# Patient Record
Sex: Female | Born: 1965 | Race: Black or African American | Hispanic: No | Marital: Married | Smoking: Former smoker
Health system: Southern US, Community
[De-identification: ages and names within clinical notes are randomized; demographics above are authoritative.]

## PROBLEM LIST (undated history)

## (undated) DIAGNOSIS — E785 Hyperlipidemia, unspecified: Secondary | ICD-10-CM

## (undated) DIAGNOSIS — K219 Gastro-esophageal reflux disease without esophagitis: Secondary | ICD-10-CM

## (undated) DIAGNOSIS — J45909 Unspecified asthma, uncomplicated: Secondary | ICD-10-CM

## (undated) DIAGNOSIS — I10 Essential (primary) hypertension: Secondary | ICD-10-CM

## (undated) DIAGNOSIS — J189 Pneumonia, unspecified organism: Secondary | ICD-10-CM

## (undated) DIAGNOSIS — K429 Umbilical hernia without obstruction or gangrene: Secondary | ICD-10-CM

## (undated) DIAGNOSIS — D649 Anemia, unspecified: Secondary | ICD-10-CM

## (undated) HISTORY — PX: DILATION AND CURETTAGE OF UTERUS: SHX78

## (undated) HISTORY — DX: Umbilical hernia without obstruction or gangrene: K42.9

## (undated) HISTORY — DX: Hyperlipidemia, unspecified: E78.5

---

## 2014-02-20 DIAGNOSIS — N870 Mild cervical dysplasia: Secondary | ICD-10-CM | POA: Insufficient documentation

## 2016-08-06 DIAGNOSIS — M25551 Pain in right hip: Secondary | ICD-10-CM | POA: Insufficient documentation

## 2016-08-06 HISTORY — DX: Pain in right hip: M25.551

## 2017-02-09 DIAGNOSIS — D259 Leiomyoma of uterus, unspecified: Secondary | ICD-10-CM | POA: Insufficient documentation

## 2017-05-24 DIAGNOSIS — H04203 Unspecified epiphora, bilateral lacrimal glands: Secondary | ICD-10-CM | POA: Insufficient documentation

## 2017-05-24 DIAGNOSIS — H2513 Age-related nuclear cataract, bilateral: Secondary | ICD-10-CM | POA: Insufficient documentation

## 2019-12-28 ENCOUNTER — Encounter (HOSPITAL_COMMUNITY): Payer: Self-pay | Admitting: Emergency Medicine

## 2019-12-28 ENCOUNTER — Emergency Department (HOSPITAL_COMMUNITY): Payer: Medicaid Other

## 2019-12-28 ENCOUNTER — Emergency Department (HOSPITAL_COMMUNITY)
Admission: EM | Admit: 2019-12-28 | Discharge: 2019-12-28 | Disposition: A | Discharge status: Home / Self Care | Payer: Medicaid Other | Attending: Emergency Medicine | Admitting: Emergency Medicine

## 2019-12-28 ENCOUNTER — Other Ambulatory Visit: Payer: Self-pay

## 2019-12-28 DIAGNOSIS — J45909 Unspecified asthma, uncomplicated: Secondary | ICD-10-CM | POA: Diagnosis not present

## 2019-12-28 DIAGNOSIS — I10 Essential (primary) hypertension: Secondary | ICD-10-CM | POA: Diagnosis not present

## 2019-12-28 DIAGNOSIS — R6883 Chills (without fever): Secondary | ICD-10-CM | POA: Insufficient documentation

## 2019-12-28 DIAGNOSIS — Z20822 Contact with and (suspected) exposure to covid-19: Secondary | ICD-10-CM | POA: Insufficient documentation

## 2019-12-28 DIAGNOSIS — R059 Cough, unspecified: Secondary | ICD-10-CM | POA: Insufficient documentation

## 2019-12-28 DIAGNOSIS — J40 Bronchitis, not specified as acute or chronic: Secondary | ICD-10-CM

## 2019-12-28 DIAGNOSIS — R300 Dysuria: Secondary | ICD-10-CM | POA: Insufficient documentation

## 2019-12-28 HISTORY — DX: Essential (primary) hypertension: I10

## 2019-12-28 HISTORY — DX: Unspecified asthma, uncomplicated: J45.909

## 2019-12-28 LAB — RESPIRATORY PANEL BY RT PCR (FLU A&B, COVID)
Influenza A by PCR: NEGATIVE
Influenza B by PCR: NEGATIVE
SARS Coronavirus 2 by RT PCR: NEGATIVE

## 2019-12-28 LAB — URINALYSIS, ROUTINE W REFLEX MICROSCOPIC
Bilirubin Urine: NEGATIVE
Glucose, UA: NEGATIVE mg/dL
Hgb urine dipstick: NEGATIVE
Ketones, ur: NEGATIVE mg/dL
Leukocytes,Ua: NEGATIVE
Nitrite: NEGATIVE
Protein, ur: NEGATIVE mg/dL
Specific Gravity, Urine: 1.011 (ref 1.005–1.030)
pH: 7 (ref 5.0–8.0)

## 2019-12-28 LAB — CBC
HCT: 41.2 % (ref 36.0–46.0)
Hemoglobin: 12.9 g/dL (ref 12.0–15.0)
MCH: 26.8 pg (ref 26.0–34.0)
MCHC: 31.3 g/dL (ref 30.0–36.0)
MCV: 85.5 fL (ref 80.0–100.0)
Platelets: 202 10*3/uL (ref 150–400)
RBC: 4.82 MIL/uL (ref 3.87–5.11)
RDW: 15 % (ref 11.5–15.5)
WBC: 6.9 10*3/uL (ref 4.0–10.5)
nRBC: 0 % (ref 0.0–0.2)

## 2019-12-28 LAB — BASIC METABOLIC PANEL
Anion gap: 8 (ref 5–15)
BUN: 5 mg/dL — ABNORMAL LOW (ref 6–20)
CO2: 29 mmol/L (ref 22–32)
Calcium: 9.5 mg/dL (ref 8.9–10.3)
Chloride: 103 mmol/L (ref 98–111)
Creatinine, Ser: 0.61 mg/dL (ref 0.44–1.00)
GFR, Estimated: >60 mL/min (ref >60)
Glucose, Bld: 93 mg/dL (ref 70–99)
Potassium: 3.3 mmol/L — ABNORMAL LOW (ref 3.5–5.1)
Sodium: 140 mmol/L (ref 135–145)

## 2019-12-28 MED ORDER — ALBUTEROL SULFATE HFA 108 (90 BASE) MCG/ACT IN AERS
2.0000 | INHALATION_SPRAY | Freq: Once | RESPIRATORY_TRACT | Status: AC
  Administered 2019-12-28: 2 via RESPIRATORY_TRACT
  Filled 2019-12-28: qty 6.7

## 2019-12-28 MED ORDER — PREDNISONE 20 MG PO TABS
60.0000 mg | ORAL_TABLET | Freq: Once | ORAL | Status: AC
  Administered 2019-12-28: 60 mg via ORAL
  Filled 2019-12-28: qty 3

## 2019-12-28 MED ORDER — PREDNISONE 20 MG PO TABS: ORAL_TABLET | ORAL | 0 refills | Status: DC

## 2019-12-28 NOTE — Discharge Instructions (Addendum)
You might have bronchitis.  Take prednisone as prescribed.  Use albuterol every 4 hours as needed for cough or wheezing  See your doctor for follow-up.  You may have interstitial cystitis so consider following up with urology  Return to ER if you have worse trouble breathing, trouble urinating, vomiting, abdominal pain.

## 2019-12-28 NOTE — ED Triage Notes (Signed)
Pt arrives to ED with complaints of x2 weeks of dysuria. Denies hematuria and fevers. Ha shad chills. Also states cough x1 month.

## 2019-12-28 NOTE — ED Provider Notes (Signed)
[facility] EMERGENCY DEPARTMENT Provider Note   CSN: [id number] Arrival date & time: 12/28/19  1140     History Chief Complaint  Patient presents with  . Dysuria    Yvonne Turner is a 54 y.o. female history of asthma, hypertension here presenting with dysuria and cough.  Patient has been having dysuria for the last month or so.  She states that she has pain when she urinates.  Denies any blood in her urine.  Patient has went to urgent care twice.  She initially went the end of October and given a course of steroids that improved her symptoms.  She felt worse again and back to urgent care and was thought to have seasonal allergies so is on Zyrtec.  Patient states that she has persistent cough and just subjective chills.  She is fully vaccinated against COVID.  No obvious Covid exposures.   The history is provided by the patient.       Past Medical History:  Diagnosis Date  . Asthma   . Hypertension     There are no problems to display for this patient.   History reviewed. No pertinent surgical history.   OB History   No obstetric history on file.     History reviewed. No pertinent family history.  Social History   Tobacco Use  . Smoking status: Not on file  Substance Use Topics  . Alcohol use: Not on file  . Drug use: Not on file    Home Medications Prior to Admission medications   Not on File    Allergies    Patient has no known allergies.  Review of Systems   Review of Systems  Constitutional: Positive for chills.  Respiratory: Positive for cough.   Genitourinary: Positive for dysuria.  All other systems reviewed and are negative.   Physical Exam Updated Vital Signs BP (!) 147/86 (BP Location: Right Arm)   Pulse 88   Temp 98.1 F (36.7 C) (Oral)   Resp (!) 26   Ht 5\' 11"  (1.803 m)   Wt (!) 154.2 kg   SpO2 100%   BMI 47.42 kg/m   Physical Exam Vitals and nursing note reviewed.  Constitutional:      Appearance: Normal  appearance.  HENT:     Head: Normocephalic.     Nose: Nose normal.     Mouth/Throat:     Mouth: Mucous membranes are moist.  Eyes:     Extraocular Movements: Extraocular movements intact.     Pupils: Pupils are equal, round, and reactive to light.  Cardiovascular:     Rate and Rhythm: Normal rate and regular rhythm.     Pulses: Normal pulses.     Heart sounds: Normal heart sounds.  Pulmonary:     Comments: Diminished throughout, no wheezing or crackles  Abdominal:     General: Abdomen is flat.     Palpations: Abdomen is soft.  Musculoskeletal:        General: Normal range of motion.     Cervical back: Normal range of motion and neck supple.  Skin:    General: Skin is warm.     Capillary Refill: Capillary refill takes less than 2 seconds.  Neurological:     General: No focal deficit present.     Mental Status: She is alert and oriented to person, place, and time.  Psychiatric:        Mood and Affect: Mood normal.        Behavior: Behavior  normal.     ED Results / Procedures / Treatments   Labs (all labs ordered are listed, but only abnormal results are displayed) Labs Reviewed  BASIC METABOLIC PANEL - Abnormal; Notable for the following components:      Result Value   Potassium 3.3 (*)    BUN 5 (*)    All other components within normal limits  RESPIRATORY PANEL BY RT PCR (FLU A&B, COVID)  URINALYSIS, ROUTINE W REFLEX MICROSCOPIC  CBC    EKG None  Radiology No results found.  Procedures Procedures (including critical care time)  Medications Ordered in ED Medications  predniSONE (DELTASONE) tablet 60 mg (60 mg Oral Given 12/28/19 1513)  albuterol (VENTOLIN HFA) 108 (90 Base) MCG/ACT inhaler 2 puff (2 puffs Inhalation Given 12/28/19 1514)    ED Course  I have reviewed the triage vital signs and the nursing notes.  Pertinent labs & imaging results that were available during my care of the patient were reviewed by me and considered in my medical decision  making (see chart for details).    MDM Rules/Calculators/A&P                         Yvonne Turner is a 54 y.o. female here presenting with cough and chills and dysuria.  Symptoms are going on for about a month or so.  Likely viral infection versus bronchitis versus UTI.  Plan to get CBC and BMP and chest x-ray.   4:28 PM Labs and UA and chest x-ray unremarkable.  Covid is sent.  I am not clear why she has some dysuria.  Patient has no vaginal discharge or signs of vaginitis.  I wonder if it some local irritation or interstitial cystitis.  Can also has bronchitis as well.  Plan to discharge home with prednisone and albuterol.  Will refer to urology for evaluation of interstitial cystitis.   Final Clinical Impression(s) / ED Diagnoses Final diagnoses:  None    Rx / DC Orders ED Discharge Orders    None       Drenda Freeze, MD 12/28/19 [phone]

## 2019-12-28 NOTE — ED Notes (Signed)
Pt d/c home per MD order. Discharge summary reviewed with pt, pt verbalizes understanding. No s/s of acute distress noted at discharge , ambulatory off unit. Reports taking an [facility] home.

## 2019-12-30 LAB — URINE CULTURE: Culture: <10000 — AB

## 2020-02-05 ENCOUNTER — Other Ambulatory Visit: Payer: Self-pay

## 2020-02-05 ENCOUNTER — Emergency Department (HOSPITAL_COMMUNITY): Payer: Medicaid Other

## 2020-02-05 ENCOUNTER — Emergency Department (HOSPITAL_COMMUNITY)
Admission: EM | Admit: 2020-02-05 | Discharge: 2020-02-06 | Disposition: A | Discharge status: Home / Self Care | Payer: Medicaid Other | Attending: Emergency Medicine | Admitting: Emergency Medicine

## 2020-02-05 ENCOUNTER — Encounter (HOSPITAL_COMMUNITY): Payer: Self-pay | Admitting: Emergency Medicine

## 2020-02-05 DIAGNOSIS — Z20822 Contact with and (suspected) exposure to covid-19: Secondary | ICD-10-CM | POA: Diagnosis not present

## 2020-02-05 DIAGNOSIS — R059 Cough, unspecified: Secondary | ICD-10-CM | POA: Insufficient documentation

## 2020-02-05 DIAGNOSIS — R072 Precordial pain: Secondary | ICD-10-CM

## 2020-02-05 DIAGNOSIS — J45909 Unspecified asthma, uncomplicated: Secondary | ICD-10-CM | POA: Diagnosis not present

## 2020-02-05 DIAGNOSIS — R0602 Shortness of breath: Secondary | ICD-10-CM | POA: Diagnosis not present

## 2020-02-05 DIAGNOSIS — R062 Wheezing: Secondary | ICD-10-CM

## 2020-02-05 DIAGNOSIS — I10 Essential (primary) hypertension: Secondary | ICD-10-CM | POA: Diagnosis not present

## 2020-02-05 LAB — CBC
HCT: 41.9 % (ref 36.0–46.0)
Hemoglobin: 13.6 g/dL (ref 12.0–15.0)
MCH: 26.8 pg (ref 26.0–34.0)
MCHC: 32.5 g/dL (ref 30.0–36.0)
MCV: 82.6 fL (ref 80.0–100.0)
Platelets: 273 10*3/uL (ref 150–400)
RBC: 5.07 MIL/uL (ref 3.87–5.11)
RDW: 14.7 % (ref 11.5–15.5)
WBC: 10 10*3/uL (ref 4.0–10.5)
nRBC: 0 % (ref 0.0–0.2)

## 2020-02-05 LAB — BASIC METABOLIC PANEL
Anion gap: 12 (ref 5–15)
BUN: 10 mg/dL (ref 6–20)
CO2: 23 mmol/L (ref 22–32)
Calcium: 9.5 mg/dL (ref 8.9–10.3)
Chloride: 104 mmol/L (ref 98–111)
Creatinine, Ser: 0.57 mg/dL (ref 0.44–1.00)
GFR, Estimated: >60 mL/min (ref >60)
Glucose, Bld: 126 mg/dL — ABNORMAL HIGH (ref 70–99)
Potassium: 3.5 mmol/L (ref 3.5–5.1)
Sodium: 139 mmol/L (ref 135–145)

## 2020-02-05 LAB — TROPONIN I (HIGH SENSITIVITY): Troponin I (High Sensitivity): <2 ng/L (ref <18)

## 2020-02-05 NOTE — ED Triage Notes (Signed)
Patient reports central chest pain with SOB onset this week , no emesis or diaphoresis , pain increases with deep inhalation/exertion . Denies fever or chills .

## 2020-02-06 LAB — TROPONIN I (HIGH SENSITIVITY): Troponin I (High Sensitivity): 3 ng/L (ref <18)

## 2020-02-06 LAB — RESP PANEL BY RT-PCR (FLU A&B, COVID) ARPGX2
Influenza A by PCR: NEGATIVE
Influenza B by PCR: NEGATIVE
SARS Coronavirus 2 by RT PCR: NEGATIVE

## 2020-02-06 MED ORDER — AZITHROMYCIN 250 MG PO TABS: 250.0000 mg | ORAL_TABLET | Freq: Every day | ORAL | 0 refills | Status: DC

## 2020-02-06 NOTE — ED Provider Notes (Signed)
[facility] EMERGENCY DEPARTMENT Provider Note   CSN: [id number] Arrival date & time: 02/05/20  2105     History Chief Complaint  Patient presents with  . Chest Pain    Yvonne Turner is a 54 y.o. female.  Patient presents to the emergency department with a chief complaint of chest pain with associated shortness of breath.  She states that she has been having the symptoms for the past week.  She denies any vomiting or diaphoresis.  She reports increased pain with deep inhalation and with exertion.  She also states that she has had subjective fevers and chills at home, but denies any cough.  She has been using her inhaler along with taking prednisone without much relief.  She states that she has been seen by her doctor, who has been unable to figure out what is causing her symptoms.  The history is provided by the patient. No language interpreter was used.       Past Medical History:  Diagnosis Date  . Asthma   . Hypertension     There are no problems to display for this patient.   History reviewed. No pertinent surgical history.   OB History   No obstetric history on file.     No family history on file.  Social History   Tobacco Use  . Smoking status: Never Smoker  . Smokeless tobacco: Never Used  Substance Use Topics  . Alcohol use: Never  . Drug use: Never    Home Medications Prior to Admission medications   Medication Sig Start Date End Date Taking? Authorizing Provider  predniSONE (DELTASONE) 20 MG tablet Take 60 mg daily x 2 days then 40 mg daily x 2 days then 20 mg daily x 2 days 12/28/19   Charlynne Pander, MD    Allergies    Patient has no known allergies.  Review of Systems   Review of Systems  All other systems reviewed and are negative.   Physical Exam Updated Vital Signs BP 135/90 (BP Location: Right Arm)   Pulse 94   Temp 98 F (36.7 C) (Oral)   Resp 18   Ht 5\' 11"  (1.803 m)   Wt (!) 154.2 kg   SpO2 96%   BMI  47.42 kg/m   Physical Exam Vitals and nursing note reviewed.  Constitutional:      General: She is not in acute distress.    Appearance: She is well-developed and well-nourished. She is obese.  HENT:     Head: Normocephalic and atraumatic.  Eyes:     Conjunctiva/sclera: Conjunctivae normal.  Cardiovascular:     Rate and Rhythm: Normal rate and regular rhythm.     Heart sounds: No murmur heard.   Pulmonary:     Effort: Pulmonary effort is normal. No respiratory distress.     Breath sounds: Normal breath sounds.     Comments: Clear to auscultation bilaterally Abdominal:     Palpations: Abdomen is soft.     Tenderness: There is no abdominal tenderness.  Musculoskeletal:        General: No edema. Normal range of motion.     Cervical back: Neck supple.  Skin:    General: Skin is warm and dry.  Neurological:     Mental Status: She is alert and oriented to person, place, and time.  Psychiatric:        Mood and Affect: Mood and affect and mood normal.        Behavior: Behavior  normal.     ED Results / Procedures / Treatments   Labs (all labs ordered are listed, but only abnormal results are displayed) Labs Reviewed  BASIC METABOLIC PANEL - Abnormal; Notable for the following components:      Result Value   Glucose, Bld 126 (*)    All other components within normal limits  RESP PANEL BY RT-PCR (FLU A&B, COVID) ARPGX2  CBC  TROPONIN I (HIGH SENSITIVITY)  TROPONIN I (HIGH SENSITIVITY)    EKG None  Radiology DG Chest 2 View  Result Date: 02/05/2020 CLINICAL DATA:  Shortness of breath. EXAM: CHEST - 2 VIEW COMPARISON:  None. FINDINGS: The heart size and mediastinal contours are within normal limits. Both lungs are clear. No pneumothorax or pleural effusion is noted. The visualized skeletal structures are unremarkable. IMPRESSION: No active cardiopulmonary disease. Electronically Signed   By: Marijo Conception M.D.   On: 02/05/2020 21:37    Procedures Procedures  (including critical care time)  Medications Ordered in ED Medications - No data to display  ED Course  I have reviewed the triage vital signs and the nursing notes.  Pertinent labs & imaging results that were available during my care of the patient were reviewed by me and considered in my medical decision making (see chart for details).    MDM Rules/Calculators/A&P                          Patient with central CP, SOB, and wheezing which has been going on all week.  She denies wheezing now.  She states that she has had some cough and subjective fevers and chills.   COVID is negative.  Trops are negative.   No acute ischemic EKG changes.  Doubt ACS.    Doubt PE, she is not hypoxic nor tachycardic.  Question early atypical infection or bronchitis.  Will trial a z-pak.    Final Clinical Impression(s) / ED Diagnoses Final diagnoses:  Wheezing  Cough  Precordial pain    Rx / DC Orders ED Discharge Orders         Ordered    azithromycin (ZITHROMAX) 250 MG tablet  Daily        02/06/20 0146           Montine Circle, PA-C 02/06/20 0156    Orpah Greek, MD 02/06/20 [phone]

## 2020-04-02 ENCOUNTER — Ambulatory Visit
Admission: RE | Admit: 2020-04-02 | Discharge: 2020-04-02 | Disposition: A | Discharge status: Home / Self Care | Payer: No Typology Code available for payment source | Source: Ambulatory Visit | Attending: Obstetrics and Gynecology | Admitting: Obstetrics and Gynecology

## 2020-04-02 ENCOUNTER — Other Ambulatory Visit: Payer: Self-pay | Admitting: Obstetrics and Gynecology

## 2020-04-02 DIAGNOSIS — R7611 Nonspecific reaction to tuberculin skin test without active tuberculosis: Secondary | ICD-10-CM

## 2020-06-24 ENCOUNTER — Other Ambulatory Visit: Payer: Self-pay

## 2020-06-24 ENCOUNTER — Encounter (HOSPITAL_COMMUNITY): Payer: Self-pay

## 2020-06-24 ENCOUNTER — Emergency Department (HOSPITAL_COMMUNITY)
Admission: EM | Admit: 2020-06-24 | Discharge: 2020-06-25 | Disposition: A | Discharge status: Home / Self Care | Payer: No Typology Code available for payment source | Attending: Emergency Medicine | Admitting: Emergency Medicine

## 2020-06-24 DIAGNOSIS — S59912A Unspecified injury of left forearm, initial encounter: Secondary | ICD-10-CM | POA: Diagnosis present

## 2020-06-24 DIAGNOSIS — J45909 Unspecified asthma, uncomplicated: Secondary | ICD-10-CM | POA: Diagnosis not present

## 2020-06-24 DIAGNOSIS — W503XXA Accidental bite by another person, initial encounter: Secondary | ICD-10-CM

## 2020-06-24 DIAGNOSIS — Y990 Civilian activity done for income or pay: Secondary | ICD-10-CM | POA: Insufficient documentation

## 2020-06-24 DIAGNOSIS — S51852A Open bite of left forearm, initial encounter: Secondary | ICD-10-CM | POA: Insufficient documentation

## 2020-06-24 DIAGNOSIS — I10 Essential (primary) hypertension: Secondary | ICD-10-CM | POA: Insufficient documentation

## 2020-06-24 MED ORDER — AMOXICILLIN-POT CLAVULANATE 875-125 MG PO TABS: 1.0000 | ORAL_TABLET | Freq: Two times a day (BID) | ORAL | 0 refills | Status: DC

## 2020-06-24 MED ORDER — AMOXICILLIN-POT CLAVULANATE 875-125 MG PO TABS
1.0000 | ORAL_TABLET | Freq: Once | ORAL | Status: AC
  Administered 2020-06-24: 1 via ORAL
  Filled 2020-06-24: qty 1
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
## 2020-06-24 NOTE — ED Notes (Signed)
All appropriate discharge materials reviewed at length with patient. Time for questions provided. Pt has no other questions at this time and verbalizes understanding of all provided materials.  
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
## 2020-06-28 DIAGNOSIS — Z20822 Contact with and (suspected) exposure to covid-19: Secondary | ICD-10-CM | POA: Diagnosis not present

## 2020-06-28 DIAGNOSIS — R0981 Nasal congestion: Secondary | ICD-10-CM | POA: Diagnosis not present

## 2020-06-28 DIAGNOSIS — J4541 Moderate persistent asthma with (acute) exacerbation: Secondary | ICD-10-CM | POA: Diagnosis not present

## 2020-07-02 ENCOUNTER — Ambulatory Visit (INDEPENDENT_AMBULATORY_CARE_PROVIDER_SITE_OTHER): Payer: BC Managed Care – PPO | Admitting: Internal Medicine

## 2020-07-02 ENCOUNTER — Encounter: Payer: Self-pay | Admitting: Internal Medicine

## 2020-07-02 VITALS — BP 144/86 | HR 90 | Temp 98.2°F | Ht 71.0 in | Wt 337.2 lb

## 2020-07-02 DIAGNOSIS — E559 Vitamin D deficiency, unspecified: Secondary | ICD-10-CM

## 2020-07-02 DIAGNOSIS — Z131 Encounter for screening for diabetes mellitus: Secondary | ICD-10-CM

## 2020-07-02 DIAGNOSIS — Z0000 Encounter for general adult medical examination without abnormal findings: Secondary | ICD-10-CM | POA: Diagnosis not present

## 2020-07-02 DIAGNOSIS — S76012A Strain of muscle, fascia and tendon of left hip, initial encounter: Secondary | ICD-10-CM

## 2020-07-02 DIAGNOSIS — J4540 Moderate persistent asthma, uncomplicated: Secondary | ICD-10-CM | POA: Diagnosis not present

## 2020-07-02 DIAGNOSIS — E785 Hyperlipidemia, unspecified: Secondary | ICD-10-CM | POA: Diagnosis not present

## 2020-07-02 DIAGNOSIS — I10 Essential (primary) hypertension: Secondary | ICD-10-CM

## 2020-07-02 LAB — POCT GLYCOSYLATED HEMOGLOBIN (HGB A1C): Hemoglobin A1C: 5.9 % — AB (ref 4.0–5.6)

## 2020-07-02 LAB — GLUCOSE, CAPILLARY: Glucose-Capillary: 102 mg/dL — ABNORMAL HIGH (ref 70–99)

## 2020-07-02 MED ORDER — DAILY-VITE MULTIVITAMIN PO TABS: 1.0000 | ORAL_TABLET | Freq: Every day | ORAL | 1 refills | Status: AC

## 2020-07-02 MED ORDER — MONTELUKAST SODIUM 10 MG PO TABS: 10.0000 mg | ORAL_TABLET | Freq: Every day | ORAL | 1 refills | Status: DC

## 2020-07-02 MED ORDER — NAPROXEN 500 MG PO TABS: 500.0000 mg | ORAL_TABLET | Freq: Two times a day (BID) | ORAL | 0 refills | Status: DC

## 2020-07-02 MED ORDER — VITAMIN D HIGH POTENCY 25 MCG (1000 UT) PO CAPS: 1000.0000 [IU] | ORAL_CAPSULE | Freq: Every day | ORAL | 1 refills | Status: DC

## 2020-07-02 MED ORDER — BUDESONIDE-FORMOTEROL FUMARATE 160-4.5 MCG/ACT IN AERO: 2.0000 | INHALATION_SPRAY | Freq: Two times a day (BID) | RESPIRATORY_TRACT | 12 refills | Status: DC

## 2020-07-02 MED ORDER — ATORVASTATIN CALCIUM 80 MG PO TABS: 80.0000 mg | ORAL_TABLET | Freq: Every day | ORAL | 1 refills | Status: DC

## 2020-07-02 MED ORDER — METHOCARBAMOL 750 MG PO TABS: ORAL_TABLET | ORAL | 0 refills | Status: AC

## 2020-07-02 MED ORDER — HYDROCHLOROTHIAZIDE 25 MG PO TABS
1.0000 | ORAL_TABLET | Freq: Every day | ORAL | 1 refills | Status: DC
Start: 2020-07-02 — End: 2021-01-04

## 2020-07-02 MED ORDER — ASPIRIN 81 MG PO TBEC: 81.0000 mg | DELAYED_RELEASE_TABLET | Freq: Every day | ORAL | 1 refills | Status: DC

## 2020-07-02 NOTE — Progress Notes (Signed)
   CC: Moderate persistent asthma, healthcare maintenance, hyperlipidemia, vitamin D deficiency, HTN, strain of gluteus medius  HPI:Ms.Marjon Doxtater is a 55 y.o. female who presents for evaluation of moderate persistent asthma, healthcare maintenance, hyperlipidemia, vitamin D deficiency, HTN, strain of gluteus medius. Please see individual problem based A/P for details.  Past Medical History:  Diagnosis Date  . Asthma   . Hypertension    No past surgical history on file.  No Known Allergies  Current Outpatient Medications on File Prior to Visit  Medication Sig Dispense Refill  . albuterol (ACCUNEB) 1.25 MG/3ML nebulizer solution SMARTSIG:1 Via Nebulizer 4 Times Daily    . amoxicillin-clavulanate (AUGMENTIN) 875-125 MG tablet Take 1 tablet by mouth every 12 (twelve) hours. 14 tablet 0   No current facility-administered medications on file prior to visit.    Family History  Problem Relation Age of Onset  . Pancreatic cancer Mother   . Diabetes type II Father    Social History   Tobacco Use  . Smoking status: Former Smoker    Packs/day: 0.50    Years: 20.00    Pack years: 10.00    Types: Cigarettes    Start date: 56    Quit date: 1994    Years since quitting: 28.4  . Smokeless tobacco: Never Used  Substance Use Topics  . Alcohol use: Never  . Drug use: Never    Review of Systems:   Review of Systems  Constitutional: Negative for chills, fever and weight loss.  Eyes: Negative for blurred vision and double vision.  Respiratory: Negative for cough, shortness of breath and wheezing.   Cardiovascular: Negative for chest pain and palpitations.  Gastrointestinal: Negative for nausea and vomiting.  Musculoskeletal: Positive for myalgias. Negative for back pain and falls.  Neurological: Negative for sensory change and weakness.  Psychiatric/Behavioral: Negative for depression and substance abuse.     Physical Exam: Vitals:   07/02/20 0848  BP: (!) 144/86  Pulse: 90   Temp: 98.2 F (36.8 C)  TempSrc: Oral  SpO2: 100%  Weight: (!) 337 lb 3.2 oz (153 kg)  Height: 5\' 11"  (1.803 m)    General: NAD, nl appearance, obese, well kept HE: Normocephalic, atraumatic , EOMI, Conjunctivae normal ENT: No congestion, no rhinorrhea, no exudate or erythema  Cardiovascular: Normal rate, regular rhythm.  No murmurs, rubs, or gallops Pulmonary : Effort normal, breath sounds normal. No wheezes, rales, or rhonchi Abdominal: soft, nontender,  bowel sounds present Musculoskeletal: no swelling , deformity, injury ,or tenderness in extremities, Skin: Warm, dry , no bruising, erythema, or rash Psychiatric/Behavioral:  normal mood, normal behavior    Assessment & Plan:   See Encounters Tab for problem based charting.  Patient discussed with Dr. Philipp Ovens

## 2020-07-02 NOTE — Patient Instructions (Signed)
Thank you for trusting me with your care. To recap, today we discussed the following:  1. Moderate persistent asthma, unspecified whether complicated  - Ambulatory referral to Pulmonology  2. Healthcare maintenance  - Ambulatory referral to Ophthalmology - Ambulatory referral to Obstetrics / Gynecology - Ambulatory referral to Dentistry - Multiple Vitamin (DAILY-VITE MULTIVITAMIN) TABS; Take 1 tablet by mouth daily.  Dispense: 90 tablet; Refill: 1 - MM Digital Screening; Future - Hepatitis C antibody - HIV antibody (with reflex) - POC Hbg A1C  3. Hyperlipidemia, unspecified hyperlipidemia type  - atorvastatin (LIPITOR) 80 MG tablet; Take 1 tablet (80 mg total) by mouth daily.  Dispense: 90 tablet; Refill: 1 - aspirin 81 MG EC tablet; Take 1 tablet (81 mg total) by mouth daily.  Dispense: 90 tablet; Refill: 1 - CMP14 + Anion Gap - Lipid Profile  4. Vitamin D deficiency  - VITAMIN D HIGH POTENCY 25 MCG (1000 UT) capsule; Take 1 capsule (1,000 Units total) by mouth daily.  Dispense: 90 capsule; Refill: 1 - Vitamin D (25 hydroxy)  5. Hypertension, unspecified type  - hydrochlorothiazide (HYDRODIURIL) 25 MG tablet; Take 1 tablet (25 mg total) by mouth daily.  Dispense: 90 tablet; Refill: 1  6. Strain of gluteus medius of left lower extremity, initial encounter  - methocarbamol (ROBAXIN) 750 MG tablet; Take 2 tablets (1,500 mg total) by mouth 3 (three) times daily for 3 days, THEN 1 tablet (750 mg total) 3 (three) times daily for 3 days.  Dispense: 27 tablet; Refill: 0 - naproxen (NAPROSYN) 500 MG tablet; Take 1 tablet (500 mg total) by mouth 2 (two) times daily with a meal.  Dispense: 28 tablet; Refill: 0

## 2020-07-03 LAB — CMP14 + ANION GAP
ALT: 24 IU/L (ref 0–32)
AST: 22 IU/L (ref 0–40)
Albumin/Globulin Ratio: 1.5 (ref 1.2–2.2)
Albumin: 4.2 g/dL (ref 3.8–4.9)
Alkaline Phosphatase: 115 IU/L (ref 44–121)
Anion Gap: 21 mmol/L — ABNORMAL HIGH (ref 10.0–18.0)
BUN/Creatinine Ratio: 20 (ref 9–23)
BUN: 17 mg/dL (ref 6–24)
Bilirubin Total: 0.2 mg/dL (ref 0.0–1.2)
CO2: 22 mmol/L (ref 20–29)
Calcium: 9.5 mg/dL (ref 8.7–10.2)
Chloride: 102 mmol/L (ref 96–106)
Creatinine, Ser: 0.87 mg/dL (ref 0.57–1.00)
Globulin, Total: 2.8 g/dL (ref 1.5–4.5)
Glucose: 96 mg/dL (ref 65–99)
Potassium: 4 mmol/L (ref 3.5–5.2)
Sodium: 145 mmol/L — ABNORMAL HIGH (ref 134–144)
Total Protein: 7 g/dL (ref 6.0–8.5)
eGFR: 78 mL/min/{1.73_m2} (ref >59)

## 2020-07-03 LAB — HIV ANTIBODY (ROUTINE TESTING W REFLEX): HIV Screen 4th Generation wRfx: NONREACTIVE

## 2020-07-03 LAB — LIPID PANEL
Chol/HDL Ratio: 2.8 ratio (ref 0.0–4.4)
Cholesterol, Total: 174 mg/dL (ref 100–199)
HDL: 63 mg/dL (ref >39)
LDL Chol Calc (NIH): 95 mg/dL (ref 0–99)
Triglycerides: 90 mg/dL (ref 0–149)
VLDL Cholesterol Cal: 16 mg/dL (ref 5–40)

## 2020-07-03 LAB — HEPATITIS C ANTIBODY: Hep C Virus Ab: <0.1 s/co ratio (ref 0.0–0.9)

## 2020-07-03 LAB — VITAMIN D 25 HYDROXY (VIT D DEFICIENCY, FRACTURES): Vit D, 25-Hydroxy: 29.5 ng/mL — ABNORMAL LOW (ref 30.0–100.0)

## 2020-07-05 ENCOUNTER — Encounter: Payer: Self-pay | Admitting: Internal Medicine

## 2020-07-05 DIAGNOSIS — S76012A Strain of muscle, fascia and tendon of left hip, initial encounter: Secondary | ICD-10-CM | POA: Insufficient documentation

## 2020-07-05 DIAGNOSIS — I10 Essential (primary) hypertension: Secondary | ICD-10-CM | POA: Insufficient documentation

## 2020-07-05 DIAGNOSIS — E559 Vitamin D deficiency, unspecified: Secondary | ICD-10-CM | POA: Insufficient documentation

## 2020-07-05 DIAGNOSIS — J45909 Unspecified asthma, uncomplicated: Secondary | ICD-10-CM | POA: Insufficient documentation

## 2020-07-05 DIAGNOSIS — E785 Hyperlipidemia, unspecified: Secondary | ICD-10-CM | POA: Insufficient documentation

## 2020-07-05 DIAGNOSIS — Z0000 Encounter for general adult medical examination without abnormal findings: Secondary | ICD-10-CM | POA: Insufficient documentation

## 2020-07-05 MED ORDER — BUDESONIDE-FORMOTEROL FUMARATE 160-4.5 MCG/ACT IN AERO: 2.0000 | INHALATION_SPRAY | Freq: Two times a day (BID) | RESPIRATORY_TRACT | 12 refills | Status: DC

## 2020-07-05 NOTE — Assessment & Plan Note (Addendum)
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 She hasnt tried anything specifc but pain has continued for weeks. The pain is aggrivated with bending over and certain stretches. The pain is in her gluteus medius. No pain over hip or spine.    Assessment/Plan: Strain of gluteus medius of left lower extremity, initial encounter - methocarbamol (ROBAXIN) 750 MG tablet; Take 2 tablets (1,500 mg total) by mouth 3 (three) times daily for 3 days, THEN 1 tablet (750 mg total) 3 (three) times daily for 3 days.  Dispense: 27 tablet; Refill: 0 - naproxen (NAPROSYN) 500 MG tablet; Take 1 tablet (500 mg total) by mouth 2 (two) times daily with a meal.  Dispense: 28 tablet; Refill: 0

## 2020-07-05 NOTE — Assessment & Plan Note (Signed)
Hypertension: Patient's BP today is 144/86 with a goal of <140/80. The patient endorses adherence to her medication regimen, but did not take her medication before today's visit.  Assessment/Plan: Hypertension, unspecified type -Continue hydrochlorothiazide (HYDRODIURIL) 25 MG tablet; Take 1 tablet (25 mg total) by mouth daily.  Dispense: 90 tablet;

## 2020-07-05 NOTE — Assessment & Plan Note (Signed)
Patient has a history of asthma.  She reports that she was given PFTs but has not had this done recently.  She was previously followed by pulmonologist in [place].  She currently uses Symbicort and albuterol.  She reports her asthma has been worse since moving to [place].  We will start montelukast.  Referral to pulmonary specialist for PFTs and evaluation.  Assessment/Plan:  Moderate persistent asthma, unspecified whether complicated - albuterol (ACCUNEB) 1.25 MG/3ML nebulizer solution; SMARTSIG:1 Via Nebulizer 4 Times Daily - Ambulatory referral to Pulmonology - montelukast (SINGULAIR) 10 MG tablet; Take 1 tablet (10 mg total) by mouth daily.  Dispense: 90 tablet; Refill: 1 - budesonide-formoterol (SYMBICORT) 160-4.5 MCG/ACT inhaler; Inhale 2 puffs into the lungs 2 (two) times daily.  Dispense: 1 each; Refill: 12

## 2020-07-05 NOTE — Assessment & Plan Note (Signed)
Healthcare maintenance - Ambulatory referral to Ophthalmology - Ambulatory referral to Obstetrics / Gynecology - Ambulatory referral to Dentistry - Multiple Vitamin (DAILY-VITE MULTIVITAMIN) TABS; Take 1 tablet by mouth daily.  Dispense: 90 tablet; Refill: 1 - MM Digital Screening; Future - Hepatitis C antibody - HIV antibody (with reflex) - POC Hbg A1C

## 2020-07-05 NOTE — Assessment & Plan Note (Signed)
Patient reports history of hyperlipidemia.  She is currently on atorvastatin 80 mg.  She was prescribed aspirin 81 mg as well. Denies history of stroke or MI.   Assessment/Plan: Hyperlipidemia - atorvastatin (LIPITOR) 80 MG tablet; Take 1 tablet (80 mg total) by mouth daily.  Dispense: 90 tablet; Refill: 1 - aspirin 81 MG EC tablet; Take 1 tablet (81 mg total) by mouth daily.  Dispense: 90 tablet; Refill: 1 - CMP14 + Anion Gap - Lipid Profile

## 2020-07-05 NOTE — Assessment & Plan Note (Signed)
Patient reports history of vitamin D deficiency.  We will check vitamin D today and adjust regimen as needed.  Assessment/Plan: Vitamin D deficiency - VITAMIN D HIGH POTENCY 25 MCG (1000 UT) capsule; Take 1 capsule (1,000 Units total) by mouth daily.  Dispense: 90 capsule; Refill: 1 - Vitamin D (25 hydroxy)

## 2020-07-09 ENCOUNTER — Telehealth: Payer: Self-pay | Admitting: Internal Medicine

## 2020-07-09 MED ORDER — DULERA 200-5 MCG/ACT IN AERO: 2.0000 | INHALATION_SPRAY | Freq: Two times a day (BID) | RESPIRATORY_TRACT | 2 refills | Status: DC

## 2020-07-09 MED ORDER — PREDNISONE 50 MG PO TABS: ORAL_TABLET | ORAL | 0 refills | Status: DC

## 2020-07-09 NOTE — Telephone Encounter (Signed)
Called patient to discuss lab results. Patient continue to have daily symptoms not improving since switched to Symbicort. Reports increased wheezing and some shortness or breath. Symptom relieved with SABA for a short period of time. Previously had better control on mometasone , but insurance did not cover Asmanex. Will prescribe Dulera, covered on medicaid. Also prednisone for acute exacerbation.

## 2020-07-09 NOTE — Progress Notes (Signed)
Unable to reach patient x2 . Left voicemail No further workup or changes to medications needed.

## 2020-07-11 NOTE — Progress Notes (Signed)
Internal Medicine Clinic Attending  Case discussed with Dr. Steen  At the time of the visit.  We reviewed the resident's history and exam and pertinent patient test results.  I agree with the assessment, diagnosis, and plan of care documented in the resident's note.  

## 2020-07-21 ENCOUNTER — Encounter: Payer: Self-pay | Admitting: *Deleted

## 2020-08-12 ENCOUNTER — Encounter: Payer: Self-pay | Admitting: Pulmonary Disease

## 2020-09-01 DIAGNOSIS — H524 Presbyopia: Secondary | ICD-10-CM | POA: Diagnosis not present

## 2020-09-01 DIAGNOSIS — H5203 Hypermetropia, bilateral: Secondary | ICD-10-CM | POA: Diagnosis not present

## 2020-09-01 DIAGNOSIS — H40023 Open angle with borderline findings, high risk, bilateral: Secondary | ICD-10-CM | POA: Diagnosis not present

## 2020-09-01 DIAGNOSIS — H52201 Unspecified astigmatism, right eye: Secondary | ICD-10-CM | POA: Diagnosis not present

## 2020-09-01 DIAGNOSIS — H2513 Age-related nuclear cataract, bilateral: Secondary | ICD-10-CM | POA: Diagnosis not present

## 2020-09-14 ENCOUNTER — Encounter: Payer: BC Managed Care – PPO | Admitting: Internal Medicine

## 2020-09-16 ENCOUNTER — Other Ambulatory Visit: Payer: Self-pay

## 2020-09-16 ENCOUNTER — Encounter: Payer: Self-pay | Admitting: Pulmonary Disease

## 2020-09-16 ENCOUNTER — Ambulatory Visit: Payer: BC Managed Care – PPO | Admitting: Pulmonary Disease

## 2020-09-16 VITALS — BP 138/84 | HR 81 | Ht 71.0 in | Wt 329.8 lb

## 2020-09-16 DIAGNOSIS — J4540 Moderate persistent asthma, uncomplicated: Secondary | ICD-10-CM | POA: Diagnosis not present

## 2020-09-16 MED ORDER — ALBUTEROL SULFATE HFA 108 (90 BASE) MCG/ACT IN AERS: 2.0000 | INHALATION_SPRAY | Freq: Four times a day (QID) | RESPIRATORY_TRACT | 2 refills | Status: DC | PRN

## 2020-09-16 NOTE — Progress Notes (Signed)
Synopsis: Referred in August 2022 for asthma by Candace Cruise, MD  Subjective:   PATIENT ID: Yvonne Turner GENDER: female DOB: [birth year], MRN: [id number]   HPI  Chief Complaint  Patient presents with   Consult    Consult for asthma. States she was diagnosed back in 2008. States her asthma is currently under control.      Kena Perfecto is a 55 year old woman, former smoker with history of hypertension who is referred to pulmonary clinic for asthma.   She has history of asthma since 2008 and has been on maintenance inhalers since.  She is currently on Dulera 200-5 MCG 2 puffs twice daily and Singulair 10 mg daily.  She has an albuterol rescue inhaler which she does not require since starting the [facility].  She recently moved from [place] last year and had urgent care visits last June due to the allergies that aggravated her asthma symptoms.  She required prednisone and nebulizer treatments at that time.  Since starting the [facility] and montelukast at that time she has not required further prednisone tapers.  She denies any nighttime awakenings.  She denies any sinus congestion or drainage.  She does have intermittent GERD symptoms which she takes omeprazole as needed about every other day.  She is originally from [place].  She is a former smoker and quit in 1994.  She denies any history of secondhand smoke exposure.  Past Medical History:  Diagnosis Date   Asthma    Hyperlipidemia    Hypertension    Umbilical hernia      Family History  Problem Relation Age of Onset   Pancreatic cancer Mother    Diabetes type II Father      Social History   Socioeconomic History   Marital status: Married    Spouse name: Not on file   Number of children: Not on file   Years of education: Not on file   Highest education level: Not on file  Occupational History   Occupation: [employer]    Comment: [employer]  Tobacco Use   Smoking status: Former    Packs/day: 0.50    Years: 20.00     Pack years: 10.00    Types: Cigarettes    Start date: 34    Quit date: 1994    Years since quitting: 28.6   Smokeless tobacco: Never  Substance and Sexual Activity   Alcohol use: Never   Drug use: Never   Sexual activity: Yes    Partners: Male  Other Topics Concern   Not on file  Social History Narrative   Not on file   Social Determinants of Health   Financial Resource Strain: Not on file  Food Insecurity: Not on file  Transportation Needs: Not on file  Physical Activity: Not on file  Stress: Not on file  Social Connections: Not on file  Intimate Partner Violence: Not on file     No Known Allergies   Outpatient Medications Prior to Visit  Medication Sig Dispense Refill   albuterol (ACCUNEB) 1.25 MG/3ML nebulizer solution SMARTSIG:1 Via Nebulizer 4 Times Daily     aspirin 81 MG EC tablet Take 1 tablet (81 mg total) by mouth daily. 90 tablet 1   atorvastatin (LIPITOR) 80 MG tablet Take 1 tablet (80 mg total) by mouth daily. 90 tablet 1   hydrochlorothiazide (HYDRODIURIL) 25 MG tablet Take 1 tablet (25 mg total) by mouth daily. 90 tablet 1   mometasone-formoterol (DULERA) 200-5 MCG/ACT AERO  Inhale 2 puffs into the lungs 2 (two) times daily. 1 each 2   montelukast (SINGULAIR) 10 MG tablet Take 1 tablet (10 mg total) by mouth daily. 90 tablet 1   Multiple Vitamin (DAILY-VITE MULTIVITAMIN) TABS Take 1 tablet by mouth daily. 90 tablet 1   naproxen (NAPROSYN) 500 MG tablet Take 1 tablet (500 mg total) by mouth 2 (two) times daily with a meal. 28 tablet 0   VITAMIN D HIGH POTENCY 25 MCG (1000 UT) capsule Take 1 capsule (1,000 Units total) by mouth daily. 90 capsule 1   amoxicillin-clavulanate (AUGMENTIN) 875-125 MG tablet Take 1 tablet by mouth every 12 (twelve) hours. 14 tablet 0   predniSONE (DELTASONE) 50 MG tablet Take one tablet daily with breakfast for 5 days. 5 tablet 0   No facility-administered medications prior to visit.    Review of Systems  Constitutional:   Negative for chills, fever, malaise/fatigue and weight loss.  HENT:  Negative for congestion, sinus pain and sore throat.   Eyes: Negative.   Respiratory:  Negative for cough, hemoptysis, sputum production, shortness of breath and wheezing.   Cardiovascular:  Negative for chest pain, palpitations, orthopnea, claudication and leg swelling.  Gastrointestinal:  Negative for abdominal pain, heartburn, nausea and vomiting.  Genitourinary: Negative.   Musculoskeletal:  Negative for joint pain and myalgias.  Skin:  Negative for rash.  Neurological:  Negative for weakness.  Endo/Heme/Allergies: Negative.   Psychiatric/Behavioral: Negative.      Objective:   Vitals:   09/16/20 0929  BP: 138/84  Pulse: 81  SpO2: 100%  Weight: (!) 329 lb 12.8 oz (149.6 kg)  Height: '5\' 11"'$  (1.803 m)     Physical Exam Constitutional:      General: She is not in acute distress.    Appearance: She is obese. She is not ill-appearing.  HENT:     Head: Normocephalic and atraumatic.  Eyes:     General: No scleral icterus.    Conjunctiva/sclera: Conjunctivae normal.     Pupils: Pupils are equal, round, and reactive to light.  Cardiovascular:     Rate and Rhythm: Normal rate and regular rhythm.     Pulses: Normal pulses.     Heart sounds: Normal heart sounds. No murmur heard. Pulmonary:     Effort: Pulmonary effort is normal.     Breath sounds: Normal breath sounds. No wheezing, rhonchi or rales.  Abdominal:     General: Bowel sounds are normal.     Palpations: Abdomen is soft.  Musculoskeletal:     Right lower leg: No edema.     Left lower leg: No edema.  Lymphadenopathy:     Cervical: No cervical adenopathy.  Skin:    General: Skin is warm and dry.  Neurological:     General: No focal deficit present.     Mental Status: She is alert.  Psychiatric:        Mood and Affect: Mood normal.        Behavior: Behavior normal.        Thought Content: Thought content normal.        Judgment: Judgment  normal.    CBC    Component Value Date/Time   WBC 10.0 02/05/2020 2122   RBC 5.07 02/05/2020 2122   HGB 13.6 02/05/2020 2122   HCT 41.9 02/05/2020 2122   PLT 273 02/05/2020 2122   MCV 82.6 02/05/2020 2122   MCH 26.8 02/05/2020 2122   MCHC 32.5 02/05/2020 2122   RDW 14.7 02/05/2020  2122   BMP Latest Ref Rng & Units 07/02/2020 02/05/2020 12/28/2019  Glucose 65 - 99 mg/dL 96 126(H) 93  BUN 6 - 24 mg/dL 17 10 5(L)  Creatinine 0.57 - 1.00 mg/dL 0.87 0.57 0.61  BUN/Creat Ratio 9 - 23 20 - -  Sodium 134 - 144 mmol/L 145(H) 139 140  Potassium 3.5 - 5.2 mmol/L 4.0 3.5 3.3(L)  Chloride 96 - 106 mmol/L 102 104 103  CO2 20 - 29 mmol/L '22 23 29  '$ Calcium 8.7 - 10.2 mg/dL 9.5 9.5 9.5   Chest imaging: CXR 04/02/20 Heart size is normal. Lung volumes are somewhat low. No edema or effusion is present. No focal airspace disease is present.  PFT: No flowsheet data found.    Assessment & Plan:   Moderate persistent asthma without complication - Plan: albuterol (VENTOLIN HFA) 108 (90 Base) MCG/ACT inhaler  Discussion: Perline Srinivasan is a 55 year old woman, former smoker with history of hypertension who is referred to pulmonary clinic for asthma.   Based on her history she appears to have moderate persistent asthma without complication at this time.  She is doing well on Dulera 200-5 MCG 2 puffs twice daily and montelukast 10 mg daily.  We will continue this regimen at this time and if she has no increase in her asthma symptoms by the next visit we will consider reducing her Dulera dose to 100-5 MCG 2 puffs twice daily.  She does not require pulmonary function testing at this time.  Should her symptoms increase and she had frequent exacerbations we will check pulmonary function testing and CBC with differential along with IgE levels.  Follow-up in 6 months.  Freda Jackson, MD [facility] Office: [phone]   Current Outpatient Medications:    albuterol (ACCUNEB)  1.25 MG/3ML nebulizer solution, SMARTSIG:1 Via Nebulizer 4 Times Daily, Disp: , Rfl:    albuterol (VENTOLIN HFA) 108 (90 Base) MCG/ACT inhaler, Inhale 2 puffs into the lungs every 6 (six) hours as needed for wheezing or shortness of breath., Disp: 8 g, Rfl: 2   aspirin 81 MG EC tablet, Take 1 tablet (81 mg total) by mouth daily., Disp: 90 tablet, Rfl: 1   atorvastatin (LIPITOR) 80 MG tablet, Take 1 tablet (80 mg total) by mouth daily., Disp: 90 tablet, Rfl: 1   hydrochlorothiazide (HYDRODIURIL) 25 MG tablet, Take 1 tablet (25 mg total) by mouth daily., Disp: 90 tablet, Rfl: 1   mometasone-formoterol (DULERA) 200-5 MCG/ACT AERO, Inhale 2 puffs into the lungs 2 (two) times daily., Disp: 1 each, Rfl: 2   montelukast (SINGULAIR) 10 MG tablet, Take 1 tablet (10 mg total) by mouth daily., Disp: 90 tablet, Rfl: 1   Multiple Vitamin (DAILY-VITE MULTIVITAMIN) TABS, Take 1 tablet by mouth daily., Disp: 90 tablet, Rfl: 1   naproxen (NAPROSYN) 500 MG tablet, Take 1 tablet (500 mg total) by mouth 2 (two) times daily with a meal., Disp: 28 tablet, Rfl: 0   VITAMIN D HIGH POTENCY 25 MCG (1000 UT) capsule, Take 1 capsule (1,000 Units total) by mouth daily., Disp: 90 capsule, Rfl: 1

## 2020-09-16 NOTE — Patient Instructions (Addendum)
Continue montelukast '10mg'$  daily  Continue to use dulera inhaler 2 puffs twice daily - rinse mouth out after each use  Use albuterol inhaler 1-2 puffs every 4-6 hours as needed for shortness of breath, chest tightness, cough or wheezing

## 2020-09-17 ENCOUNTER — Encounter: Payer: Self-pay | Admitting: Pulmonary Disease

## 2020-09-23 ENCOUNTER — Encounter: Payer: BC Managed Care – PPO | Admitting: Family Medicine

## 2020-09-23 NOTE — Addendum Note (Signed)
Addended by: Hulan Fray on: 09/23/2020 07:50 PM   Modules accepted: Orders

## 2020-09-30 ENCOUNTER — Ambulatory Visit (INDEPENDENT_AMBULATORY_CARE_PROVIDER_SITE_OTHER): Payer: BC Managed Care – PPO | Admitting: Internal Medicine

## 2020-09-30 ENCOUNTER — Encounter: Payer: Self-pay | Admitting: Internal Medicine

## 2020-09-30 ENCOUNTER — Encounter: Payer: Self-pay | Admitting: *Deleted

## 2020-09-30 ENCOUNTER — Other Ambulatory Visit: Payer: Self-pay

## 2020-09-30 VITALS — BP 143/63 | HR 83 | Ht 71.0 in | Wt 337.0 lb

## 2020-09-30 DIAGNOSIS — M5442 Lumbago with sciatica, left side: Secondary | ICD-10-CM | POA: Diagnosis not present

## 2020-09-30 DIAGNOSIS — M545 Low back pain, unspecified: Secondary | ICD-10-CM

## 2020-09-30 DIAGNOSIS — I10 Essential (primary) hypertension: Secondary | ICD-10-CM

## 2020-09-30 DIAGNOSIS — S76012A Strain of muscle, fascia and tendon of left hip, initial encounter: Secondary | ICD-10-CM

## 2020-09-30 MED ORDER — METHOCARBAMOL 750 MG PO TABS: ORAL_TABLET | ORAL | 0 refills | Status: AC

## 2020-09-30 MED ORDER — NAPROXEN 500 MG PO TABS
500.0000 mg | ORAL_TABLET | Freq: Two times a day (BID) | ORAL | 0 refills | Status: DC
Start: 2020-09-30 — End: 2020-10-09

## 2020-09-30 NOTE — Patient Instructions (Signed)
Thank you for trusting me with your care. To recap, today we discussed the following:   Lumbar back pain  - MR Lumbar Spine Wo Contrast; Future - naproxen (NAPROSYN) 500 MG tablet; Take 1 tablet (500 mg total) by mouth 2 (two) times daily with a meal for 10 days.  Dispense: 20 tablet; Refill: 0 - methocarbamol (ROBAXIN) 750 MG tablet; Take 2 tablets (1,500 mg total) by mouth 3 (three) times daily for 3 days, THEN 1 tablet (750 mg total) 3 (three) times daily for 3 days.  Dispense: 27 tablet; Refill: 0 - Ambulatory referral to Physical Therapy

## 2020-09-30 NOTE — Assessment & Plan Note (Signed)
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 Treatment with muscle relaxer and NSAID improved pain , but pain never went away. She has continued to have pain and rates it 8/10. This pain has kept her out of work one day. She is concerned with this limiting her function. Today her pain starts paraspinous and wraps around to left hip. She has some weakness with getting up from chair, now weakness at knee. No changes in sensation or red flag symptoms. This could be MSK pain , but given ongoing pain and limitation I have ordered MRI of lumbar Spine. Patient will see PT and I have prescribed some conservative therapy. If this becomes chronic we will discuss other intervention in order to avoid daily NSAIDs   Lumbar back pain - MR Lumbar Spine Wo Contrast; Future - naproxen (NAPROSYN) 500 MG tablet; Take 1 tablet (500 mg total) by mouth 2 (two) times daily with a meal for 10 days.  Dispense: 20 tablet; Refill: 0 - methocarbamol (ROBAXIN) 750 MG tablet; Take 2 tablets (1,500 mg total) by mouth 3 (three) times daily for 3 days, THEN 1 tablet (750 mg total) 3 (three) times daily for 3 days.  Dispense: 27 tablet; Refill: 0 - Ambulatory referral to Physical Therapy

## 2020-09-30 NOTE — Progress Notes (Signed)
   CC: lumbar back pain  HPI:[name] is a 55 y.o. female who presents for evaluation of lumbar back pain. Please see individual problem based A/P for details.   Past Medical History:  Diagnosis Date   Asthma    Hyperlipidemia    Hypertension    Umbilical hernia    Review of Systems:   Review of Systems  Constitutional:  Negative for chills and fever.  Musculoskeletal:  Positive for back pain. Negative for falls.  Neurological:  Positive for weakness. Negative for tingling.  Psychiatric/Behavioral:  Negative for depression. The patient is not nervous/anxious.     Physical Exam: Vitals:   09/30/20 0934 09/30/20 0935  BP:  (!) 143/63  Pulse:  83  SpO2:  100%  Weight: (!) 337 lb (152.9 kg)   Height: '5\' 11"'$  (1.803 m)    General: Obese, pleasant , appears weak on left side upon standing  HEENT: Conjunctiva nl , antiicteric sclerae, moist mucous membranes Cardiovascular: Normal rate, regular rhythm.  No murmurs, rubs, or gallops Pulmonary : Equal breath sounds, No wheezes, rales, or rhonchi Back: TTP over left Paraspinous muscles, 4/5 left hip flexion, nl strength at knee, Sensation grossly intact and equal bilaterally.  Assessment & Plan:   See Encounters Tab for problem based charting.  Patient discussed with Dr. Dareen Piano

## 2020-10-01 NOTE — Assessment & Plan Note (Signed)
BP 143/63 today. Patient did not take her medication this morning.   - Continue hydrochlorothiazide 25 mg daily

## 2020-10-05 NOTE — Progress Notes (Signed)
Internal Medicine Clinic Attending  Case discussed with Dr. Steen  At the time of the visit.  We reviewed the resident's history and exam and pertinent patient test results.  I agree with the assessment, diagnosis, and plan of care documented in the resident's note.  

## 2020-10-09 ENCOUNTER — Other Ambulatory Visit: Payer: Self-pay | Admitting: *Deleted

## 2020-10-09 DIAGNOSIS — M545 Low back pain, unspecified: Secondary | ICD-10-CM

## 2020-10-09 MED ORDER — DULERA 200-5 MCG/ACT IN AERO: 2.0000 | INHALATION_SPRAY | Freq: Two times a day (BID) | RESPIRATORY_TRACT | 2 refills | Status: DC

## 2020-10-09 MED ORDER — NAPROXEN 500 MG PO TABS: 500.0000 mg | ORAL_TABLET | Freq: Two times a day (BID) | ORAL | 0 refills | Status: AC

## 2020-10-09 NOTE — Telephone Encounter (Signed)
Faxed refill request from pt's pharmacy for [place] Requesting 90-day supply

## 2020-10-13 ENCOUNTER — Other Ambulatory Visit: Payer: Self-pay | Admitting: Internal Medicine

## 2020-10-13 NOTE — Telephone Encounter (Signed)
Refill Request-  Pt states she went to the pharmacy this weekend to pick up her medication and was denied.  Pt states she was instructed to call her PCP in reference to to prescription needing to be written for 90 days.  Pt requesting a call back.  mometasone-formoterol (DULERA) 200-5 MCG/ACT AERO

## 2020-10-15 ENCOUNTER — Ambulatory Visit: Payer: BC Managed Care – PPO

## 2020-10-19 DIAGNOSIS — U071 COVID-19: Secondary | ICD-10-CM | POA: Diagnosis not present

## 2020-10-19 DIAGNOSIS — R059 Cough, unspecified: Secondary | ICD-10-CM | POA: Diagnosis not present

## 2020-10-20 MED ORDER — DULERA 200-5 MCG/ACT IN AERO: 2.0000 | INHALATION_SPRAY | Freq: Two times a day (BID) | RESPIRATORY_TRACT | 2 refills | Status: DC

## 2020-10-27 ENCOUNTER — Other Ambulatory Visit: Payer: Self-pay

## 2020-10-27 DIAGNOSIS — Z8616 Personal history of COVID-19: Secondary | ICD-10-CM | POA: Diagnosis not present

## 2020-10-29 ENCOUNTER — Ambulatory Visit
Admission: RE | Admit: 2020-10-29 | Discharge: 2020-10-29 | Disposition: A | Discharge status: Home / Self Care | Payer: BC Managed Care – PPO | Source: Ambulatory Visit | Attending: Internal Medicine | Admitting: Internal Medicine

## 2020-10-29 ENCOUNTER — Other Ambulatory Visit: Payer: Self-pay

## 2020-10-29 DIAGNOSIS — Z0000 Encounter for general adult medical examination without abnormal findings: Secondary | ICD-10-CM

## 2020-10-29 DIAGNOSIS — Z1231 Encounter for screening mammogram for malignant neoplasm of breast: Secondary | ICD-10-CM | POA: Diagnosis not present

## 2020-11-19 ENCOUNTER — Ambulatory Visit: Payer: BC Managed Care – PPO

## 2020-11-30 ENCOUNTER — Ambulatory Visit: Payer: BC Managed Care – PPO

## 2020-11-30 ENCOUNTER — Other Ambulatory Visit: Payer: Self-pay

## 2020-12-03 ENCOUNTER — Ambulatory Visit (INDEPENDENT_AMBULATORY_CARE_PROVIDER_SITE_OTHER): Payer: BC Managed Care – PPO

## 2020-12-03 ENCOUNTER — Telehealth: Payer: Self-pay | Admitting: Internal Medicine

## 2020-12-03 DIAGNOSIS — Z23 Encounter for immunization: Secondary | ICD-10-CM

## 2020-12-03 NOTE — Telephone Encounter (Signed)
This patient walked in today in reference to her OBGYN REF that was [facility]  The patient no showed her appointment because it was with a female Provider and she specifically requested a Female Provider.  This referral has no been closed and a new Referral is requested.  Please advise a new order can be placed.

## 2020-12-04 ENCOUNTER — Other Ambulatory Visit: Payer: Self-pay | Admitting: Internal Medicine

## 2020-12-04 DIAGNOSIS — Z124 Encounter for screening for malignant neoplasm of cervix: Secondary | ICD-10-CM

## 2020-12-04 NOTE — Progress Notes (Signed)
New referral placed to Ob/gyn with request for female provider.

## 2020-12-10 ENCOUNTER — Other Ambulatory Visit: Payer: Self-pay

## 2020-12-10 ENCOUNTER — Ambulatory Visit (HOSPITAL_COMMUNITY)
Admission: RE | Admit: 2020-12-10 | Discharge: 2020-12-10 | Disposition: A | Discharge status: Home / Self Care | Payer: BC Managed Care – PPO | Source: Ambulatory Visit | Attending: Student in an Organized Health Care Education/Training Program | Admitting: Student in an Organized Health Care Education/Training Program

## 2020-12-10 DIAGNOSIS — M545 Low back pain, unspecified: Secondary | ICD-10-CM | POA: Diagnosis not present

## 2020-12-15 NOTE — Progress Notes (Signed)
Patient called.  Unable to reach patient. Attempted x 2. Will attempt again tomorrow.

## 2020-12-16 ENCOUNTER — Other Ambulatory Visit: Payer: Self-pay | Admitting: Internal Medicine

## 2020-12-16 DIAGNOSIS — M5442 Lumbago with sciatica, left side: Secondary | ICD-10-CM

## 2020-12-16 NOTE — Progress Notes (Signed)
Patient has ongoing lower back pain. MRI ordered on office visit 09/30/2020 shows  small left subarticular disc protrusion at L5-S1 contacting the S1 nerve root in the lateral recess and grade 1 anterolisthesis at L4-5. Nl disc space L1-L3. Patient unable to afford PT ($500 per session without meeting deductible) and not improving with conservative treatment. We have discussed weight loss. Patient has found relief with lidocaine patches. I will send a prescription. Will send patient to sports medicine for further treatment.

## 2020-12-18 ENCOUNTER — Other Ambulatory Visit: Payer: Self-pay | Admitting: Internal Medicine

## 2020-12-18 DIAGNOSIS — M5442 Lumbago with sciatica, left side: Secondary | ICD-10-CM

## 2020-12-18 MED ORDER — LIDOCAINE 5 % EX PTCH: 1.0000 | MEDICATED_PATCH | CUTANEOUS | 0 refills | Status: DC

## 2020-12-18 NOTE — Progress Notes (Signed)
See note from 11/9. Orders placed under ID Context. Will resend under IM so referral goes through [facility].  Acute left-sided low back pain with left-sided sciatica - lidocaine (LIDODERM) 5 %; Place 1 patch onto the skin daily. Remove & Discard patch within 12 hours or as directed by MD  Dispense: 30 patch; Refill: 0 - Ambulatory referral to Sports Medicine

## 2020-12-18 NOTE — Addendum Note (Signed)
Addended by: Lyndal Pulley on: 12/18/2020 01:48 PM   Modules accepted: Orders

## 2020-12-22 ENCOUNTER — Telehealth: Payer: Self-pay

## 2020-12-22 NOTE — Telephone Encounter (Signed)
DECISION :    Outcome  Approved today   Status:Approved;  Prior Auth;Coverage Start   Date:11/22/2020;Coverage End Date:12/22/2021;     ( COPY SENT TO PHARMACY ALSO )

## 2020-12-22 NOTE — Telephone Encounter (Signed)
PA came through on cover my meds for pt ( LIDOCAINE 5% PATCHES )  was done and sent back through with office notes from 8/24  awaiting approval or denial

## 2020-12-25 ENCOUNTER — Ambulatory Visit: Payer: BC Managed Care – PPO | Admitting: Family Medicine

## 2021-01-04 ENCOUNTER — Other Ambulatory Visit: Payer: Self-pay | Admitting: *Deleted

## 2021-01-04 DIAGNOSIS — J4540 Moderate persistent asthma, uncomplicated: Secondary | ICD-10-CM

## 2021-01-04 DIAGNOSIS — I10 Essential (primary) hypertension: Secondary | ICD-10-CM

## 2021-01-04 DIAGNOSIS — E785 Hyperlipidemia, unspecified: Secondary | ICD-10-CM

## 2021-01-04 MED ORDER — MONTELUKAST SODIUM 10 MG PO TABS: 10.0000 mg | ORAL_TABLET | Freq: Every day | ORAL | 1 refills | Status: DC

## 2021-01-04 MED ORDER — ATORVASTATIN CALCIUM 80 MG PO TABS: 80.0000 mg | ORAL_TABLET | Freq: Every day | ORAL | 1 refills | Status: DC

## 2021-01-04 MED ORDER — HYDROCHLOROTHIAZIDE 25 MG PO TABS: 25.0000 mg | ORAL_TABLET | Freq: Every day | ORAL | 1 refills | Status: DC

## 2021-02-07 DIAGNOSIS — U071 COVID-19: Secondary | ICD-10-CM

## 2021-02-07 HISTORY — DX: COVID-19: U07.1

## 2021-03-22 ENCOUNTER — Other Ambulatory Visit: Payer: Self-pay

## 2021-03-22 ENCOUNTER — Ambulatory Visit: Payer: BC Managed Care – PPO | Admitting: Pulmonary Disease

## 2021-03-22 ENCOUNTER — Encounter: Payer: Self-pay | Admitting: Pulmonary Disease

## 2021-03-22 VITALS — BP 124/72 | HR 84 | Ht 71.0 in | Wt 325.0 lb

## 2021-03-22 DIAGNOSIS — J4540 Moderate persistent asthma, uncomplicated: Secondary | ICD-10-CM

## 2021-03-22 MED ORDER — ALBUTEROL SULFATE 1.25 MG/3ML IN NEBU: INHALATION_SOLUTION | RESPIRATORY_TRACT | 6 refills | Status: DC

## 2021-03-22 MED ORDER — DULERA 200-5 MCG/ACT IN AERO: 2.0000 | INHALATION_SPRAY | Freq: Two times a day (BID) | RESPIRATORY_TRACT | 2 refills | Status: DC

## 2021-03-22 MED ORDER — ALBUTEROL SULFATE HFA 108 (90 BASE) MCG/ACT IN AERS: 2.0000 | INHALATION_SPRAY | Freq: Four times a day (QID) | RESPIRATORY_TRACT | 2 refills | Status: DC | PRN

## 2021-03-22 NOTE — Patient Instructions (Addendum)
Continue dulera 2 puffs twice daily - rinse mouth out after each use  Continue to use albuterol 1-2 puffs every 4 hours as needed  Continue montelukast (singulair) 10mg  daily  Try Zyrtec or Allegra daily for allergies if they become an issues this spring/summer

## 2021-03-22 NOTE — Progress Notes (Signed)
Synopsis: Referred in August 2022 for asthma by Candace Cruise, MD  Subjective:   PATIENT ID: Yvonne Turner GENDER: female DOB: 1966/01/23, MRN: [id number]  HPI  Chief Complaint  Patient presents with   Follow-up    6 mo f/u for asthma. States she has not had any flare ups since last visit.    Yvonne Turner is a 56 year old woman, former smoker with history of hypertension who returns to pulmonary clinic for asthma.   She continues on dulera 200-39mcg 2 puffs twice daily. She is using albuterol about 1-2 times per week for shortness of breath. She denies any complaints of wheezing, cough or shortness of breath. She does not have night time awakenings.   OV 09/16/20 She has history of asthma since 2008 and has been on maintenance inhalers since.  She is currently on Dulera 200-5 MCG 2 puffs twice daily and Singulair 10 mg daily.  She has an albuterol rescue inhaler which she does not require since starting the [facility].  She recently moved from [place] last year and had urgent care visits last June due to the allergies that aggravated her asthma symptoms.  She required prednisone and nebulizer treatments at that time.  Since starting the [facility] and montelukast at that time she has not required further prednisone tapers.  She denies any nighttime awakenings.  She denies any sinus congestion or drainage.  She does have intermittent GERD symptoms which she takes omeprazole as needed about every other day.  She is originally from [place].  She is a former smoker and quit in 1994.  She denies any history of secondhand smoke exposure.  Past Medical History:  Diagnosis Date   Asthma    Hyperlipidemia    Hypertension    Umbilical hernia      Family History  Problem Relation Age of Onset   Pancreatic cancer Mother    Diabetes type II Father      Social History   Socioeconomic History   Marital status: Married    Spouse name: Not on file   Number of children: Not on file   Years of  education: Not on file   Highest education level: Not on file  Occupational History   Occupation: [employer]    Comment: [employer]  Tobacco Use   Smoking status: Former    Packs/day: 0.50    Years: 20.00    Pack years: 10.00    Types: Cigarettes    Start date: 61    Quit date: 1994    Years since quitting: 29.1   Smokeless tobacco: Never  Substance and Sexual Activity   Alcohol use: Never   Drug use: Never   Sexual activity: Yes    Partners: Male  Other Topics Concern   Not on file  Social History Narrative   Not on file   Social Determinants of Health   Financial Resource Strain: Not on file  Food Insecurity: Not on file  Transportation Needs: Not on file  Physical Activity: Not on file  Stress: Not on file  Social Connections: Not on file  Intimate Partner Violence: Not on file     No Known Allergies   Outpatient Medications Prior to Visit  Medication Sig Dispense Refill   albuterol (ACCUNEB) 1.25 MG/3ML nebulizer solution SMARTSIG:1 Via Nebulizer 4 Times Daily     albuterol (VENTOLIN HFA) 108 (90 Base) MCG/ACT inhaler Inhale 2 puffs into the lungs every 6 (six) hours as needed for wheezing or  shortness of breath. 8 g 2   aspirin 81 MG EC tablet Take 1 tablet (81 mg total) by mouth daily. 90 tablet 1   atorvastatin (LIPITOR) 80 MG tablet Take 1 tablet (80 mg total) by mouth daily. 90 tablet 1   hydrochlorothiazide (HYDRODIURIL) 25 MG tablet Take 1 tablet (25 mg total) by mouth daily. 90 tablet 1   lidocaine (LIDODERM) 5 % Place 1 patch onto the skin daily. Remove & Discard patch within 12 hours or as directed by MD 30 patch 0   mometasone-formoterol (DULERA) 200-5 MCG/ACT AERO Inhale 2 puffs into the lungs 2 (two) times daily. 3 each 2   montelukast (SINGULAIR) 10 MG tablet Take 1 tablet (10 mg total) by mouth daily. 90 tablet 1   Multiple Vitamin (DAILY-VITE MULTIVITAMIN) TABS Take 1 tablet by mouth daily. 90 tablet 1   VITAMIN D HIGH POTENCY 25 MCG  (1000 UT) capsule Take 1 capsule (1,000 Units total) by mouth daily. 90 capsule 1   No facility-administered medications prior to visit.    Review of Systems  Constitutional:  Negative for chills, fever, malaise/fatigue and weight loss.  HENT:  Negative for congestion, sinus pain and sore throat.   Eyes: Negative.   Respiratory:  Negative for cough, hemoptysis, sputum production, shortness of breath and wheezing.   Cardiovascular:  Negative for chest pain, palpitations, orthopnea, claudication and leg swelling.  Gastrointestinal:  Negative for abdominal pain, heartburn, nausea and vomiting.  Genitourinary: Negative.   Musculoskeletal:  Negative for joint pain and myalgias.  Skin:  Negative for rash.  Neurological:  Negative for weakness.  Endo/Heme/Allergies: Negative.   Psychiatric/Behavioral: Negative.      Objective:   Vitals:   03/22/21 0920  BP: 124/72  Pulse: 84  SpO2: 100%  Weight: (!) 325 lb (147.4 kg)  Height: 5\' 11"  (1.803 m)   Physical Exam Constitutional:      General: She is not in acute distress.    Appearance: She is obese. She is not ill-appearing.  HENT:     Head: Normocephalic and atraumatic.  Eyes:     General: No scleral icterus.    Conjunctiva/sclera: Conjunctivae normal.  Cardiovascular:     Rate and Rhythm: Normal rate and regular rhythm.     Pulses: Normal pulses.     Heart sounds: Normal heart sounds. No murmur heard. Pulmonary:     Effort: Pulmonary effort is normal.     Breath sounds: Normal breath sounds. No wheezing, rhonchi or rales.  Musculoskeletal:     Right lower leg: No edema.     Left lower leg: No edema.  Skin:    General: Skin is warm and dry.  Neurological:     General: No focal deficit present.     Mental Status: She is alert.  Psychiatric:        Mood and Affect: Mood normal.        Behavior: Behavior normal.        Thought Content: Thought content normal.        Judgment: Judgment normal.    CBC    Component  Value Date/Time   WBC 10.0 02/05/2020 2122   RBC 5.07 02/05/2020 2122   HGB 13.6 02/05/2020 2122   HCT 41.9 02/05/2020 2122   PLT 273 02/05/2020 2122   MCV 82.6 02/05/2020 2122   MCH 26.8 02/05/2020 2122   MCHC 32.5 02/05/2020 2122   RDW 14.7 02/05/2020 2122   BMP Latest Ref Rng & Units 07/02/2020 02/05/2020  12/28/2019  Glucose 65 - 99 mg/dL 96 126(H) 93  BUN 6 - 24 mg/dL 17 10 5(L)  Creatinine 0.57 - 1.00 mg/dL 0.87 0.57 0.61  BUN/Creat Ratio 9 - 23 20 - -  Sodium 134 - 144 mmol/L 145(H) 139 140  Potassium 3.5 - 5.2 mmol/L 4.0 3.5 3.3(L)  Chloride 96 - 106 mmol/L 102 104 103  CO2 20 - 29 mmol/L 22 23 29   Calcium 8.7 - 10.2 mg/dL 9.5 9.5 9.5   Chest imaging: CXR 04/02/20 Heart size is normal. Lung volumes are somewhat low. No edema or effusion is present. No focal airspace disease is present.  PFT: No flowsheet data found.    Assessment & Plan:   Moderate persistent asthma without complication  Discussion: Yvonne Turner is a 56 year old woman, former smoker with history of hypertension who returns to pulmonary clinic for asthma.   She is doing well on Dulera 200-5 MCG 2 puffs twice daily and montelukast 10 mg daily.  We will continue this regimen at this time and if she has no increase in her asthma symptoms by the next visit we will consider reducing her Dulera dose to 100-5 MCG 2 puffs twice daily after she experiences the troublesome Spring and summer months due to her allergies.  She does not require pulmonary function testing at this time.  Should her symptoms increase and she have frequent exacerbations we will check pulmonary function testing and CBC with differential along with IgE levels.  Follow-up in 1 year.  Freda Jackson, MD [facility] Office: [phone]   Current Outpatient Medications:    albuterol (ACCUNEB) 1.25 MG/3ML nebulizer solution, SMARTSIG:1 Via Nebulizer 4 Times Daily, Disp: , Rfl:    albuterol (VENTOLIN HFA) 108 (90  Base) MCG/ACT inhaler, Inhale 2 puffs into the lungs every 6 (six) hours as needed for wheezing or shortness of breath., Disp: 8 g, Rfl: 2   aspirin 81 MG EC tablet, Take 1 tablet (81 mg total) by mouth daily., Disp: 90 tablet, Rfl: 1   atorvastatin (LIPITOR) 80 MG tablet, Take 1 tablet (80 mg total) by mouth daily., Disp: 90 tablet, Rfl: 1   hydrochlorothiazide (HYDRODIURIL) 25 MG tablet, Take 1 tablet (25 mg total) by mouth daily., Disp: 90 tablet, Rfl: 1   lidocaine (LIDODERM) 5 %, Place 1 patch onto the skin daily. Remove & Discard patch within 12 hours or as directed by MD, Disp: 30 patch, Rfl: 0   mometasone-formoterol (DULERA) 200-5 MCG/ACT AERO, Inhale 2 puffs into the lungs 2 (two) times daily., Disp: 3 each, Rfl: 2   montelukast (SINGULAIR) 10 MG tablet, Take 1 tablet (10 mg total) by mouth daily., Disp: 90 tablet, Rfl: 1   Multiple Vitamin (DAILY-VITE MULTIVITAMIN) TABS, Take 1 tablet by mouth daily., Disp: 90 tablet, Rfl: 1   VITAMIN D HIGH POTENCY 25 MCG (1000 UT) capsule, Take 1 capsule (1,000 Units total) by mouth daily., Disp: 90 capsule, Rfl: 1

## 2021-03-24 ENCOUNTER — Encounter: Payer: Self-pay | Admitting: Family Medicine

## 2021-03-24 ENCOUNTER — Ambulatory Visit: Payer: BC Managed Care – PPO | Admitting: Family Medicine

## 2021-03-24 ENCOUNTER — Other Ambulatory Visit: Payer: Self-pay

## 2021-03-24 ENCOUNTER — Other Ambulatory Visit (HOSPITAL_COMMUNITY)
Admission: RE | Admit: 2021-03-24 | Discharge: 2021-03-24 | Disposition: A | Discharge status: Home / Self Care | Payer: BC Managed Care – PPO | Source: Ambulatory Visit | Attending: Family Medicine | Admitting: Family Medicine

## 2021-03-24 VITALS — BP 131/75 | HR 79 | Wt 328.9 lb

## 2021-03-24 DIAGNOSIS — Z8742 Personal history of other diseases of the female genital tract: Secondary | ICD-10-CM | POA: Diagnosis not present

## 2021-03-24 DIAGNOSIS — N950 Postmenopausal bleeding: Secondary | ICD-10-CM

## 2021-03-24 DIAGNOSIS — Z7689 Persons encountering health services in other specified circumstances: Secondary | ICD-10-CM | POA: Diagnosis not present

## 2021-03-24 LAB — POCT PREGNANCY, URINE: Preg Test, Ur: NEGATIVE

## 2021-03-24 NOTE — Progress Notes (Signed)
° °  GYNECOLOGY PROBLEM  VISIT ENCOUNTER NOTE  Subjective:   Yvonne Turner is a 56 y.o.  [phone] . female here for a problem GYN visit.  Current complaints: had bleeding December for a couple weeks, reports flow. No bleeding since. Reports having a DC in 2019 and had AUB at that time. She has not had a period since that time until Oct-Dec.   She denies abdominal pain.  Denies abnormal vaginal bleeding, discharge, pelvic pain, problems with intercourse or other gynecologic concerns.    Gynecologic History No LMP recorded. Patient is postmenopausal.  Contraception: post menopausal status  Health Maintenance Due  Topic Date Due   COVID-19 Vaccine (1) Never done   TETANUS/TDAP  Never done   PAP SMEAR-Modifier  Never done   COLONOSCOPY (Pts 45-24yrs Insurance coverage will need to be confirmed)  Never done   Zoster Vaccines- Shingrix (1 of 2) Never done    The following portions of the patient's history were reviewed and updated as appropriate: allergies, current medications, past family history, past medical history, past social history, past surgical history and problem list.  Review of Systems Pertinent items are noted in HPI.   Objective:  BP 131/75    Pulse 79    Wt (!) 328 lb 14.4 oz (149.2 kg)    BMI 45.87 kg/m  Gen: well appearing, NAD HEENT: no scleral icterus CV: RR Lung: Normal WOB Ext: warm well perfused  PELVIC: Normal appearing external genitalia; normal appearing vaginal mucosa and cervix.  No abnormal discharge noted.      ENDOMETRIAL BIOPSY     The indications for endometrial biopsy were reviewed.   Risks of the biopsy including cramping, bleeding, infection, uterine perforation, inadequate specimen and need for additional procedures  were discussed. The patient states she understands and agrees to undergo procedure today. Consent was signed. Time out was performed. Urine HCG was not indicated.  During the pelvic exam, the cervix was prepped with Betadine. A  single-toothed tenaculum was placed on the anterior lip of the cervix to stabilize it. The 3 mm pipelle was introduced into the endometrial cavity without difficulty to a depth of 10cm, and a moderate amount of tissue was obtained and sent to pathology. The instruments were removed from the patient's vagina. Minimal bleeding from the cervix was noted. The patient tolerated the procedure well. Routine post-procedure instructions were given to the patient.     Assessment and Plan:   1. Postmenopausal bleeding Reviewed typical work up with EMB and [place]. Reviewed concern for endometrial cancer with postmenopausal bleeding Results will be back in 72hr - 1 week. Patient does not have mychart.  - US PELVIC COMPLETE WITH TRANSVAGINAL; Future - Surgical pathology( [facility]/ [facility])  2. Establishing care with new doctor, encounter for Need ROI from prior GYN and procedures  Please refer to After Visit Summary for other counseling recommendations.   Return in about 4 weeks (around 04/21/2021) for After [place] , Telehealth/Virtual health GYN visit if desired.  Caren Macadam, MD, MPH, ABFM Attending [place] for [facility]

## 2021-03-26 LAB — SURGICAL PATHOLOGY

## 2021-03-29 ENCOUNTER — Ambulatory Visit: Admission: RE | Admit: 2021-03-29 | Payer: BC Managed Care – PPO | Source: Ambulatory Visit

## 2021-03-29 ENCOUNTER — Telehealth: Payer: Self-pay

## 2021-03-29 NOTE — Telephone Encounter (Addendum)
-----   Message from Caren Macadam, MD sent at 03/26/2021  4:27 PM EST ----- EMB is negative for cancer and hyperplasia. Recommend follow up if bleeding reoccurs  Left message that her results from her appt on 03/24/21 are negative and if she has questions to please give the office a return call back.  If not we will see you at your appt scheduled on 04/26/21 @ 0835.    Frances Nickels  03/29/21

## 2021-03-31 ENCOUNTER — Other Ambulatory Visit: Payer: Self-pay

## 2021-03-31 ENCOUNTER — Ambulatory Visit
Admission: RE | Admit: 2021-03-31 | Discharge: 2021-03-31 | Disposition: A | Discharge status: Home / Self Care | Payer: BC Managed Care – PPO | Source: Ambulatory Visit | Attending: Family Medicine | Admitting: Family Medicine

## 2021-03-31 DIAGNOSIS — D259 Leiomyoma of uterus, unspecified: Secondary | ICD-10-CM | POA: Diagnosis not present

## 2021-03-31 DIAGNOSIS — N852 Hypertrophy of uterus: Secondary | ICD-10-CM | POA: Diagnosis not present

## 2021-03-31 DIAGNOSIS — N950 Postmenopausal bleeding: Secondary | ICD-10-CM | POA: Diagnosis not present

## 2021-04-07 ENCOUNTER — Encounter: Payer: Self-pay | Admitting: Family Medicine

## 2021-04-15 ENCOUNTER — Encounter: Payer: Self-pay | Admitting: *Deleted

## 2021-04-26 ENCOUNTER — Ambulatory Visit: Payer: BC Managed Care – PPO | Admitting: Obstetrics and Gynecology

## 2021-04-26 ENCOUNTER — Ambulatory Visit: Payer: BC Managed Care – PPO | Admitting: Family Medicine

## 2021-05-28 ENCOUNTER — Telehealth: Payer: Self-pay

## 2021-05-28 ENCOUNTER — Ambulatory Visit (INDEPENDENT_AMBULATORY_CARE_PROVIDER_SITE_OTHER): Payer: BC Managed Care – PPO | Admitting: Internal Medicine

## 2021-05-28 ENCOUNTER — Encounter: Payer: Self-pay | Admitting: Internal Medicine

## 2021-05-28 ENCOUNTER — Other Ambulatory Visit: Payer: Self-pay

## 2021-05-28 VITALS — BP 132/83 | HR 85 | Temp 98.4°F | Ht 71.0 in | Wt 340.4 lb

## 2021-05-28 DIAGNOSIS — M5442 Lumbago with sciatica, left side: Secondary | ICD-10-CM | POA: Diagnosis not present

## 2021-05-28 DIAGNOSIS — Z0000 Encounter for general adult medical examination without abnormal findings: Secondary | ICD-10-CM

## 2021-05-28 DIAGNOSIS — Z23 Encounter for immunization: Secondary | ICD-10-CM

## 2021-05-28 DIAGNOSIS — Z1211 Encounter for screening for malignant neoplasm of colon: Secondary | ICD-10-CM | POA: Diagnosis not present

## 2021-05-28 DIAGNOSIS — I10 Essential (primary) hypertension: Secondary | ICD-10-CM

## 2021-05-28 LAB — POCT GLYCOSYLATED HEMOGLOBIN (HGB A1C): Hemoglobin A1C: 5.7 % — AB (ref 4.0–5.6)

## 2021-05-28 LAB — GLUCOSE, CAPILLARY: Glucose-Capillary: 92 mg/dL (ref 70–99)

## 2021-05-28 MED ORDER — WEGOVY 0.25 MG/0.5ML ~~LOC~~ SOAJ: 0.2500 mg | SUBCUTANEOUS | 0 refills | Status: DC

## 2021-05-28 MED ORDER — NAPROXEN 500 MG PO TABS
500.0000 mg | ORAL_TABLET | Freq: Two times a day (BID) | ORAL | 0 refills | Status: AC
Start: 2021-05-28 — End: 2021-06-07

## 2021-05-28 NOTE — Patient Instructions (Addendum)
Thank you for trusting me with your care. To recap, today we discussed the following: ? ?Low back pain, weight loss, and prediabetes ? ? ?Labs: ?- BMP8+Anion Gap ?- POC Hbg A1C ? ?Referral: ?- Amb Referral To Provider Referral Exercise Program (P.R.E.P) ? ?Medications: ?- Semaglutide-Weight Management (WEGOVY) 0.25 MG/0.5ML SOAJ; Inject 0.25 mg into the skin once a week.  Dispense: 2 mL; Refill: 0 ?- naproxen (NAPROSYN) 500 MG tablet; Take 1 tablet (500 mg total) by mouth 2 (two) times daily with a meal for 10 days.  Dispense: 20 tablet; Refill: 0 ? ? ?

## 2021-05-28 NOTE — Telephone Encounter (Signed)
Called to discuss PREP program referral, left voicemail  

## 2021-05-28 NOTE — Progress Notes (Signed)
? ?  CC: high blood pressure,low back pain, and prediabetes  ? ?HPI: ? ?[name] is a 56 y.o. female who presents for evaluation of hypertension, low back pain, and prediabetes Please  see individual problem based A/P for details. ? ? ?Past Medical History:  ?Diagnosis Date  ? Asthma   ? Hyperlipidemia   ? Hypertension   ? Umbilical hernia   ? ?Review of Systems:   ?Review of Systems  ?Respiratory:  Negative for shortness of breath and wheezing.   ?Cardiovascular:  Negative for chest pain and leg swelling.   ? ?Physical Exam: ?Vitals:  ? 05/28/21 0942  ?BP: 132/83  ?Pulse: 85  ?Temp: 98.4 ?F (36.9 ?C)  ?TempSrc: Oral  ?SpO2: 98%  ?Weight: (!) 340 lb 6.4 oz (154.4 kg)  ?Height: '5\' 11"'$  (1.803 m)  ? ? ? ?Physical Exam ?Constitutional:   ?   Appearance: She is obese.  ?   Comments: Well kept, pleasant  ?Cardiovascular:  ?   Rate and Rhythm: Normal rate and regular rhythm.  ?Pulmonary:  ?   Effort: Pulmonary effort is normal.  ?   Breath sounds: Normal breath sounds. No wheezing.  ?Musculoskeletal:  ?   Lumbar back: No bony tenderness.  ?   Comments: No focal weakness  ?Skin: ?   General: Skin is warm and dry.  ?Psychiatric:     ?   Mood and Affect: Mood normal.     ?   Behavior: Behavior normal.  ? ? ? ?Assessment & Plan:  ? ?No problem-specific Assessment & Plan notes found for this encounter. ? ? ? ?Patient discussed with Dr. Angelia Mould ? ?

## 2021-05-29 LAB — BMP8+ANION GAP
Anion Gap: 14 mmol/L (ref 10.0–18.0)
BUN/Creatinine Ratio: 10 (ref 9–23)
BUN: 6 mg/dL (ref 6–24)
CO2: 26 mmol/L (ref 20–29)
Calcium: 9.1 mg/dL (ref 8.7–10.2)
Chloride: 105 mmol/L (ref 96–106)
Creatinine, Ser: 0.58 mg/dL (ref 0.57–1.00)
Glucose: 90 mg/dL (ref 70–99)
Potassium: 4.2 mmol/L (ref 3.5–5.2)
Sodium: 145 mmol/L — ABNORMAL HIGH (ref 134–144)
eGFR: 105 mL/min/{1.73_m2} (ref >59)

## 2021-05-30 ENCOUNTER — Encounter: Payer: Self-pay | Admitting: Internal Medicine

## 2021-05-30 DIAGNOSIS — Z1211 Encounter for screening for malignant neoplasm of colon: Secondary | ICD-10-CM | POA: Insufficient documentation

## 2021-05-30 MED ORDER — TETANUS-DIPHTH-ACELL PERTUSSIS 5-2.5-18.5 LF-MCG/0.5 IM SUSP: 0.5000 mL | Freq: Once | INTRAMUSCULAR | 0 refills | Status: AC

## 2021-05-30 MED ORDER — ZOSTER VAC RECOMB ADJUVANTED 50 MCG/0.5ML IM SUSR: 0.5000 mL | Freq: Once | INTRAMUSCULAR | 0 refills | Status: AC

## 2021-05-30 NOTE — Assessment & Plan Note (Signed)
MRI in November showed L5-S1 disc protrusion. PT and referral to sports medicine not completed due to high deductible . Patient experience left sided radiculopathy today. Sometimes has right sided radiculopathy. Having acute pain the last week. No specific exacerbating event. ? ?Assessment/Plan: Acute on Chronic back pain with sciatica. Discussed goal of weight loss for long term strategy. Starting [facility] to help with weight loss.  Referral to P.R.E.P. Naprosyn for acute inflammation. ?- Amb Referral To Provider Referral Exercise Program (P.R.E.P) ?- Semaglutide-Weight Management (WEGOVY) 0.25 MG/0.5ML SOAJ; Inject 0.25 mg into the skin once a week.  Dispense: 2 mL; Refill: 0 ?- Naprosyn 500 mg BID for 10 days  ? ? ?

## 2021-05-30 NOTE — Assessment & Plan Note (Signed)
-   Zoster Vaccine Adjuvanted [facility] injection; Inject 0.5 mLs into the muscle once for 1 dose.  Dispense: 0.5 mL; Refill: 0 ?- Tdap (BOOSTRIX) 5-2.5-18.5 LF-MCG/0.5 injection; Inject 0.5 mLs into the muscle once for 1 dose.  Dispense: 0.5 mL; Refill: 0 ? ?

## 2021-05-30 NOTE — Assessment & Plan Note (Signed)
Patient's BP today is 132/82 with a goal of <140/80. The patient endorses adherence to HCTZ 25 mg daily.  ? ?Assessment/Plan: HTN, controlled on current regimen . Patient eats low salt diet.  ?- Continue HCTZ 25 mg daily ?- Counseled on weight loss.  ?

## 2021-05-30 NOTE — Assessment & Plan Note (Signed)
Last colonoscopy reported to be in 2022 in [place]. Patient believes follow up was recommended in 10 years. One polyp removed. Signed record release form today for [place] to obtain records to follow this up.  ?

## 2021-06-01 NOTE — Progress Notes (Signed)
Internal Medicine Clinic Attending  Case discussed with Dr. Steen  At the time of the visit.  We reviewed the resident's history and exam and pertinent patient test results.  I agree with the assessment, diagnosis, and plan of care documented in the resident's note.  

## 2021-06-02 ENCOUNTER — Encounter: Payer: Self-pay | Admitting: Family Medicine

## 2021-06-02 ENCOUNTER — Ambulatory Visit (INDEPENDENT_AMBULATORY_CARE_PROVIDER_SITE_OTHER): Payer: BC Managed Care – PPO | Admitting: Family Medicine

## 2021-06-02 VITALS — BP 148/91 | HR 77 | Ht 71.0 in | Wt 334.9 lb

## 2021-06-02 DIAGNOSIS — N939 Abnormal uterine and vaginal bleeding, unspecified: Secondary | ICD-10-CM | POA: Diagnosis not present

## 2021-06-02 NOTE — Progress Notes (Signed)
? ?  GYNECOLOGY PROBLEM  VISIT ENCOUNTER NOTE ? ?Subjective:  ? Yvonne Turner is a 56 y.o.  [phone] . female here for a problem GYN visit.  Current complaints: had bleeding December for a couple weeks, reports flow. No bleeding since. Reports having a DC in 2019 and had AUB at that time- records in [place] and showed normal endometrium and also had fibroids removed. She has not had a period since that time until Oct-Dec 2022 which prompted our visit on 2/15. She is s/p EMB with me that show inactive endometrium and has since also had an [place] that showed a 16.24m stripe.   She denies abdominal pain or recent bleeding. ? ?[place]- [place] Had [place] hysteroscopy with removal of fibroids, pathology was proliferative  ? ?EMB here 2/15 was inactive endometrium with no malignancy ? ?[place] 2[place]22  ?FINDINGS: ?Uterus ?  ?Measurements: 11 x 6.4 x 8.1 cm = volume: 298.2 mL. Multiple uterine ?masses consistent with fibroids. Exophytic left lower uterine ?segment fibroid measuring 7.8 x 6.1 x 5.2 cm. Multiple small uterine ?corpus fibroids. Anterior subserosal fibroid measures 16 x 12 x 15 ?mm. Posterior fundal fibroid measures 32 x 28 x 36 mm. ?  ?Endometrium ?  ?Thickness: 16.4 mm.  No focal abnormality visualized. ?  ?Right ovary ?  ?Not seen ?  ?Left ovary ?  ?Not seen ?  ?Other findings ?  ?No abnormal free fluid. ?  ?IMPRESSION: ?1. Endometrial thickness of 16.4 mm. In the setting of ?post-menopausal bleeding, endometrial sampling is indicated to ?exclude carcinoma. If results are benign, sonohysterogram should be ?considered for focal lesion work-up. (Ref: Radiological Reasoning: ?Algorithmic Workup of Abnormal Vaginal Bleeding with Endovaginal ?Sonography and Sonohysterography. AJR 2008;; [id number] ?2. Enlarged uterus with multiple fibroids ?3. Nonvisualized ovaries. ? ?Denies abnormal vaginal bleeding, discharge, pelvic pain, problems with intercourse or other gynecologic concerns.  ?   ?Gynecologic History ?No LMP recorded. Patient is postmenopausal. ? ?Contraception: post menopausal status ? ?Health Maintenance Due  ?Topic Date Due  ? COLONOSCOPY (Pts 45-425yrInsurance coverage will need to be confirmed)  Never done  ? ? ?The following portions of the patient's history were reviewed and updated as appropriate: allergies, current medications, past family history, past medical history, past social history, past surgical history and problem list. ? ?Review of Systems ?Pertinent items are noted in HPI. ?  ?Objective:  ?BP (!) 148/91   Pulse 77   Ht '5\' 11"'$  (1.803 m)   Wt (!) 334 lb 14.4 oz (151.9 kg)   BMI 46.71 kg/m?  ?Gen: well appearing, NAD ?HEENT: no scleral icterus ?CV: RR ?Lung: Normal WOB ?Ext: warm well perfused ? ? ?Assessment and Plan:  ? ?1. Morbid obesity ([place]?Risk factor for endometrial cancer ? ?2. Abnormal uterine bleeding (AUB) ?EMB was normal but discussed concerning thickened endometrial stripe especially after DC in 2019.  I recommended D&C hysteroscopy with one of my partners and patient is in agreement. Discussed I will forward her chart to them for review and if needed they will schedule an appointment prior to scheduling surgery.  ? ? ?Please refer to After Visit Summary for other counseling recommendations.  ? ?Return in about 4 weeks (around 06/30/2021), or for pre op-- NeErnestina Patchesill send chart for review to GYN surgeon and they might need a pre op telehealt. ? ?KiCaren MacadamMD, MPH, ABFM ?Attending Physician ?[facility] ?

## 2021-06-07 DIAGNOSIS — H40023 Open angle with borderline findings, high risk, bilateral: Secondary | ICD-10-CM | POA: Diagnosis not present

## 2021-06-07 DIAGNOSIS — H2513 Age-related nuclear cataract, bilateral: Secondary | ICD-10-CM | POA: Diagnosis not present

## 2021-06-22 ENCOUNTER — Telehealth: Payer: Self-pay | Admitting: Internal Medicine

## 2021-06-22 NOTE — Telephone Encounter (Signed)
Pt requesting a call back about her lab Reustls ?

## 2021-07-05 ENCOUNTER — Emergency Department (HOSPITAL_COMMUNITY): Payer: BC Managed Care – PPO

## 2021-07-05 ENCOUNTER — Emergency Department (HOSPITAL_COMMUNITY)
Admission: EM | Admit: 2021-07-05 | Discharge: 2021-07-05 | Disposition: A | Discharge status: Home / Self Care | Payer: BC Managed Care – PPO | Attending: Emergency Medicine | Admitting: Emergency Medicine

## 2021-07-05 ENCOUNTER — Other Ambulatory Visit: Payer: Self-pay

## 2021-07-05 DIAGNOSIS — M25511 Pain in right shoulder: Secondary | ICD-10-CM | POA: Diagnosis not present

## 2021-07-05 DIAGNOSIS — M25561 Pain in right knee: Secondary | ICD-10-CM | POA: Diagnosis not present

## 2021-07-05 DIAGNOSIS — W19XXXA Unspecified fall, initial encounter: Secondary | ICD-10-CM

## 2021-07-05 DIAGNOSIS — M25519 Pain in unspecified shoulder: Secondary | ICD-10-CM | POA: Diagnosis not present

## 2021-07-05 DIAGNOSIS — S80919A Unspecified superficial injury of unspecified knee, initial encounter: Secondary | ICD-10-CM | POA: Diagnosis not present

## 2021-07-05 DIAGNOSIS — I10 Essential (primary) hypertension: Secondary | ICD-10-CM | POA: Diagnosis not present

## 2021-07-05 DIAGNOSIS — Z7982 Long term (current) use of aspirin: Secondary | ICD-10-CM | POA: Diagnosis not present

## 2021-07-05 DIAGNOSIS — W1839XA Other fall on same level, initial encounter: Secondary | ICD-10-CM | POA: Diagnosis not present

## 2021-07-05 MED ORDER — KETOROLAC TROMETHAMINE 15 MG/ML IJ SOLN
15.0000 mg | Freq: Once | INTRAMUSCULAR | Status: AC
  Administered 2021-07-05: 15 mg via INTRAMUSCULAR
  Filled 2021-07-05: qty 1

## 2021-07-05 MED ORDER — NAPROXEN 375 MG PO TABS: 375.0000 mg | ORAL_TABLET | Freq: Two times a day (BID) | ORAL | 0 refills | Status: DC

## 2021-07-05 NOTE — ED Notes (Signed)
Patient discharge instructions reviewed with the patient. The patient verbalized understanding of instructions. Patient discharged. 

## 2021-07-05 NOTE — ED Triage Notes (Signed)
Pt with R shoulder and R knee pain after mechanical fall without LOC or hitting head at laundromat this morning. Pt ambulatory on scene.

## 2021-07-05 NOTE — ED Provider Notes (Signed)
[facility] EMERGENCY DEPARTMENT Provider Note   CSN: [id number] Arrival date & time: 07/05/21  7915     History Chief Complaint  Patient presents with   Yvonne Turner    Yvonne Turner is a 56 y.o. female presents the emergency department after mechanical trip and fall at the [facility] this morning.  Patient states that she was getting some money at the ATM when she stepped down on uneven surface and fell onto her right side.  She is complaining of right knee pain and right shoulder pain.  She did not hit her head nor did she lose consciousness.  Pain has been constant since onset.  Patient has been ambulatory since the accident.   Fall      Home Medications Prior to Admission medications   Medication Sig Start Date End Date Taking? Authorizing Provider  naproxen (NAPROSYN) 375 MG tablet Take 1 tablet (375 mg total) by mouth 2 (two) times daily. 07/05/21  Yes Raul Del, Horris Speros M, PA-C  albuterol (ACCUNEB) 1.25 MG/3ML nebulizer solution SMARTSIG:1 Via Nebulizer 4 Times Daily 03/22/21   Freddi Starr, MD  albuterol (VENTOLIN HFA) 108 (90 Base) MCG/ACT inhaler Inhale 2 puffs into the lungs every 6 (six) hours as needed for wheezing or shortness of breath. 03/22/21   Freddi Starr, MD  aspirin 81 MG EC tablet Take 1 tablet (81 mg total) by mouth daily. 07/02/20   Madalyn Rob, MD  atorvastatin (LIPITOR) 80 MG tablet Take 1 tablet (80 mg total) by mouth daily. 01/04/21   Madalyn Rob, MD  hydrochlorothiazide (HYDRODIURIL) 25 MG tablet Take 1 tablet (25 mg total) by mouth daily. 01/04/21   Madalyn Rob, MD  mometasone-formoterol [facility]) 200-5 MCG/ACT AERO Inhale 2 puffs into the lungs 2 (two) times daily. 03/22/21   Freddi Starr, MD  montelukast (SINGULAIR) 10 MG tablet Take 1 tablet (10 mg total) by mouth daily. 01/04/21 01/04/22  Madalyn Rob, MD  Multiple Vitamin (DAILY-VITE MULTIVITAMIN) TABS Take 1 tablet by mouth daily. 07/02/20   Madalyn Rob, MD   Semaglutide-Weight Management [facility]) 0.25 MG/0.5ML SOAJ Inject 0.25 mg into the skin once a week. 05/28/21   Madalyn Rob, MD  VITAMIN D HIGH POTENCY 25 MCG (1000 UT) capsule Take 1 capsule (1,000 Units total) by mouth daily. 07/02/20   Madalyn Rob, MD      Allergies    Patient has no known allergies.    Review of Systems   Review of Systems  All other systems reviewed and are negative.  Physical Exam Updated Vital Signs BP (!) 151/85 (BP Location: Right Arm)   Pulse 73   Temp 98.5 F (36.9 C) (Oral)   Resp 16   SpO2 99%  Physical Exam Vitals and nursing note reviewed.  Constitutional:      Appearance: Normal appearance.  HENT:     Head: Normocephalic and atraumatic.  Eyes:     General:        Right eye: No discharge.        Left eye: No discharge.     Conjunctiva/sclera: Conjunctivae normal.  Pulmonary:     Effort: Pulmonary effort is normal.  Musculoskeletal:     Comments: Right knee is mildly tender to palpation.  Right shoulder is tender to palpation.  Limited range of motion to both joints secondary to pain.  She is neurovascular intact distal to the shoulder and knee.  Skin:    General: Skin is warm and dry.     Findings: No rash.  Neurological:     General: No focal deficit present.     Mental Status: She is alert.  Psychiatric:        Mood and Affect: Mood normal.        Behavior: Behavior normal.    ED Results / Procedures / Treatments   Labs (all labs ordered are listed, but only abnormal results are displayed) Labs Reviewed - No data to display  EKG None  Radiology DG Shoulder Right  Result Date: 07/05/2021 CLINICAL DATA:  Right shoulder pain following a fall. EXAM: RIGHT SHOULDER - 2+ VIEW COMPARISON:  None Available. FINDINGS: Mild greater tuberosity hyperostosis.  No fracture or dislocation. IMPRESSION: No fracture or dislocation. Electronically Signed   By: Claudie Revering M.D.   On: 07/05/2021 09:47   DG Knee 2 Views Right  Result Date:  07/05/2021 CLINICAL DATA:  Right knee pain following a fall. EXAM: RIGHT KNEE - 1-2 VIEW COMPARISON:  None Available. FINDINGS: Moderate to marked medial joint space narrowing. Mild-to-moderate medial and lateral spur formation and minimal patellofemoral spur formation. No fracture, dislocation or effusion seen. IMPRESSION: 1. No fracture. 2. Tricompartmental degenerative changes. Electronically Signed   By: Claudie Revering M.D.   On: 07/05/2021 09:49    Procedures Procedures    Medications Ordered in ED Medications  ketorolac (TORADOL) 15 MG/ML injection 15 mg (15 mg Intramuscular Given 07/05/21 1001)    ED Course/ Medical Decision Making/ A&P                           Medical Decision Making Yvonne Turner is a 56 y.o. female who presents to the emergency department today after a mechanical trip and fall.  Given the mechanism of injury we will obtain x-rays to evaluate for possible fracture dislocation of the shoulder or knee.  We will provide her some pain medication here in the emergency department.  Patient was ambulatory to and from x-ray.  I personally ordered and interpreted imaging of the right shoulder and right knee.  No signs of overt fracture dislocation in the shoulder or knee.  I do agree with the radiologist interpretation.  I will provide her with a sling in the right shoulder for comfort and give her a prescription for naproxen and have her follow-up with orthopedics for further evaluation.  Strict return precautions were discussed with the patient at the bedside.  She is safe discharge.   Amount and/or Complexity of Data Reviewed Radiology: ordered.  Risk Prescription drug management.   Final Clinical Impression(s) / ED Diagnoses Final diagnoses:  Fall, initial encounter  Acute pain of right shoulder  Acute pain of right knee    Rx / DC Orders ED Discharge Orders          Ordered    naproxen (NAPROSYN) 375 MG tablet  2 times daily        07/05/21 1005               Myna Bright [place], [place] 07/05/21 Ruth, Marcellus, DO 07/05/21 1538

## 2021-07-05 NOTE — Discharge Instructions (Signed)
Your imaging was negative for any fractures or dislocations which is great news.  This is likely muscular strain from the fall.  Please take naproxen twice daily as needed for pain.  I am also going to give you a number to follow-up with orthopedics.  Please call them for further evaluation.  Please return to the emergency department for any worsening symptoms.

## 2021-07-07 ENCOUNTER — Telehealth: Payer: Self-pay

## 2021-07-07 ENCOUNTER — Ambulatory Visit: Payer: BC Managed Care – PPO | Admitting: Obstetrics & Gynecology

## 2021-07-07 VITALS — BP 129/62 | HR 83 | Wt 328.3 lb

## 2021-07-07 DIAGNOSIS — N939 Abnormal uterine and vaginal bleeding, unspecified: Secondary | ICD-10-CM

## 2021-07-07 NOTE — Progress Notes (Signed)
Patient rescheduled to see female surgeon per her request

## 2021-07-07 NOTE — Telephone Encounter (Signed)
Pa for pt [facility] ) came through on cover my meds .. while submitting a message from plan came through message as follows ...     Outcome  Additional Information Required  Drug is covered by current benefit plan. No further PA activity needed   Drug Dulera 200-5MCG/ACT aerosol   Engineer, production PA Form [id number] NCPDP)       ( COPY SEN TO PHARMACY ALSO )

## 2021-07-19 DIAGNOSIS — S46001A Unspecified injury of muscle(s) and tendon(s) of the rotator cuff of right shoulder, initial encounter: Secondary | ICD-10-CM | POA: Diagnosis not present

## 2021-07-19 DIAGNOSIS — M1711 Unilateral primary osteoarthritis, right knee: Secondary | ICD-10-CM | POA: Diagnosis not present

## 2021-07-27 ENCOUNTER — Ambulatory Visit: Payer: BC Managed Care – PPO | Admitting: Obstetrics & Gynecology

## 2021-08-11 ENCOUNTER — Encounter: Payer: Self-pay | Admitting: Student

## 2021-08-11 ENCOUNTER — Ambulatory Visit: Payer: 59 | Admitting: Student

## 2021-08-11 VITALS — BP 122/48 | HR 82 | Temp 98.2°F | Wt 328.2 lb

## 2021-08-11 DIAGNOSIS — Z87891 Personal history of nicotine dependence: Secondary | ICD-10-CM

## 2021-08-11 DIAGNOSIS — Z1211 Encounter for screening for malignant neoplasm of colon: Secondary | ICD-10-CM

## 2021-08-11 DIAGNOSIS — B351 Tinea unguium: Secondary | ICD-10-CM | POA: Diagnosis not present

## 2021-08-11 DIAGNOSIS — I10 Essential (primary) hypertension: Secondary | ICD-10-CM

## 2021-08-11 DIAGNOSIS — Z6841 Body Mass Index (BMI) 40.0 and over, adult: Secondary | ICD-10-CM

## 2021-08-11 DIAGNOSIS — E785 Hyperlipidemia, unspecified: Secondary | ICD-10-CM | POA: Diagnosis not present

## 2021-08-11 DIAGNOSIS — Z0000 Encounter for general adult medical examination without abnormal findings: Secondary | ICD-10-CM

## 2021-08-11 NOTE — Assessment & Plan Note (Signed)
Patient denies any cardiovascular events such as MI or CVA.  She reports adherence to atorvastatin 80 mg daily.  Last lipid panel was 06/2020 with LDL 95.  -Recheck lipid panel today.  Her goal LDL < 100  Addendum Her LDL is slightly above goal (< 100).  She never had a cardiovascular event.  Patient said that she missed her atorvastatin once or twice a week due to work.  She wished not to start another medication.  I advised patient to try to take her medication more consistently.  I suggested using a pillbox to help.  Patient agreed with the plan.  Recheck lipid panel at next visit.

## 2021-08-11 NOTE — Assessment & Plan Note (Signed)
Blood pressure is well controlled at 122/48.  She report adherence to her HCTZ.  Last BMP in April was unremarkable.  -Continue HCTZ 25 mg daily

## 2021-08-11 NOTE — Progress Notes (Signed)
   CC: Follow-up on blood pressure  HPI:  [name] is a 56 y.o. with past medical history of hypertension, hyperlipidemia, asthma who present to the clinic to follow-up on her blood pressure.  Please see problem based charting for detail  Past Medical History:  Diagnosis Date   Asthma    Hyperlipidemia    Hypertension    Umbilical hernia    Review of Systems:  per HPI  Physical Exam:  Vitals:   08/11/21 0936  BP: (!) 122/48  Pulse: 82  Temp: 98.2 F (36.8 C)  TempSrc: Oral  SpO2: 100%  Weight: (!) 328 lb 3.2 oz (148.9 kg)   Physical Exam Constitutional:      General: She is not in acute distress.    Appearance: She is obese.  HENT:     Head: Normocephalic.  Eyes:     General:        Right eye: No discharge.        Left eye: No discharge.     Conjunctiva/sclera: Conjunctivae normal.  Cardiovascular:     Rate and Rhythm: Normal rate and regular rhythm.  Pulmonary:     Effort: Pulmonary effort is normal. No respiratory distress.     Breath sounds: Normal breath sounds. No wheezing.  Abdominal:     General: Bowel sounds are normal.     Palpations: Abdomen is soft.  Musculoskeletal:        General: Normal range of motion.  Skin:    General: Skin is warm.  Neurological:     Mental Status: She is alert and oriented to person, place, and time.  Psychiatric:        Mood and Affect: Mood normal.      Assessment & Plan:   Hypertension Blood pressure is well controlled at 122/48.  She report adherence to her HCTZ.  Last BMP in April was unremarkable.  -Continue HCTZ 25 mg daily  Hyperlipidemia Patient denies any cardiovascular events such as MI or CVA.  She reports adherence to atorvastatin 80 mg daily.  Last lipid panel was 06/2020 with LDL 95.  -Recheck lipid panel today.  Her goal LDL < 100  Morbid obesity ([place]) Patient was started on Wegovy last visit however she could not pick up the medication due to pharmacy backorder.  Patient and husband  decided to not start this medication due to injection form.  They would like to proceed with diet control.  She lost about 12 pounds since April (340 => 328 lbs).  She has cut back on fried and fatty food.  She has increased her vegetable intake.  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  -I congratulated patient on her progress. -I offered physician assistant exercise program at the [facility].  Patient states that she has talked to them last time (referred by Dr. Court Joy) and declined their offer given her lower back pain.  She would like to think more about it.   Colon cancer screening We still do not have the colonoscopy record.   See Encounters Tab for problem based charting.  Patient discussed with Dr. Jimmye Norman

## 2021-08-11 NOTE — Patient Instructions (Addendum)
Yvonne Turner,  It was a pleasure seeing you in the clinic today.  Here is a summary what we talked about:  1.  Your blood pressure is well controlled today.  Please continue HCTZ 25 mg daily.  2.  I will recheck your cholesterol numbers today.  Please continue atorvastatin 80 mg daily.  3.  You can stop taking baby aspirin.  4.  Please continue to work on your diet for weight loss.  5.  We will try to get the record for your colonoscopy.  Please return in 6 months  Take care  Dr. Alfonse Spruce

## 2021-08-11 NOTE — Assessment & Plan Note (Signed)
Patient was started on Wegovy last visit however she could not pick up the medication due to pharmacy backorder.  Patient and husband decided to not start this medication due to injection form.  They would like to proceed with diet control.  She lost about 12 pounds since April (340 => 328 lbs).  She has cut back on fried and fatty food.  She has increased her vegetable intake.  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  -I congratulated patient on her progress. -I offered physician assistant exercise program at the [facility].  Patient states that she has talked to them last time (referred by Dr. Court Joy) and declined their offer given her lower back pain.  She would like to think more about it.

## 2021-08-11 NOTE — Assessment & Plan Note (Addendum)
We still do not have the colonoscopy record.  We thought that she has signed a release of information form in April but it was not found in media.  Care everywhere showed that she has had a colonoscopy in February 2022 but no result.  We will ask patient to sign another release of information form at her next visit.

## 2021-08-12 LAB — LIPID PANEL
Chol/HDL Ratio: 3.6 ratio (ref 0.0–4.4)
Cholesterol, Total: 188 mg/dL (ref 100–199)
HDL: 52 mg/dL (ref >39)
LDL Chol Calc (NIH): 115 mg/dL — ABNORMAL HIGH (ref 0–99)
Triglycerides: 115 mg/dL (ref 0–149)
VLDL Cholesterol Cal: 21 mg/dL (ref 5–40)

## 2021-08-17 NOTE — Progress Notes (Signed)
Internal Medicine Clinic Attending  Case discussed with Dr. Nguyen  At the time of the visit.  We reviewed the resident's history and exam and pertinent patient test results.  I agree with the assessment, diagnosis, and plan of care documented in the resident's note. 

## 2021-08-18 ENCOUNTER — Other Ambulatory Visit: Payer: Self-pay

## 2021-08-18 DIAGNOSIS — J4540 Moderate persistent asthma, uncomplicated: Secondary | ICD-10-CM

## 2021-08-18 MED ORDER — FLUTICASONE FUROATE-VILANTEROL 100-25 MCG/ACT IN AEPB: 1.0000 | INHALATION_SPRAY | Freq: Every day | RESPIRATORY_TRACT | 2 refills | Status: DC

## 2021-08-26 ENCOUNTER — Encounter: Payer: Self-pay | Admitting: Obstetrics & Gynecology

## 2021-08-26 ENCOUNTER — Ambulatory Visit (INDEPENDENT_AMBULATORY_CARE_PROVIDER_SITE_OTHER): Payer: 59 | Admitting: Obstetrics & Gynecology

## 2021-08-26 VITALS — BP 138/74 | HR 82 | Wt 330.0 lb

## 2021-08-26 DIAGNOSIS — R9389 Abnormal findings on diagnostic imaging of other specified body structures: Secondary | ICD-10-CM | POA: Diagnosis not present

## 2021-08-26 DIAGNOSIS — N950 Postmenopausal bleeding: Secondary | ICD-10-CM | POA: Diagnosis not present

## 2021-08-26 NOTE — Progress Notes (Signed)
GYNECOLOGY OFFICE SURGICAL CONSULT NOTE  History:   Yvonne Turner is a 56 y.o. [phone] here today to discuss recommended Hysteroscopy, Dilation and Curettage for postmenopausal bleeding. Ultrasound done 03/31/2021 showed 16.4 mm stripe and endometrial biopsy on  03/24/2021 showed scant benign inactive endometrium, negative for hyperplasia or malignancy.  There was concern about inadequacy of this specimen, so Hysteroscopy, D&C was recommended.  She denies any bleeding since last episode in December 2022. Denies any abnormal vaginal discharge, bleeding, pelvic pain or other concerns.    Past Medical History:  Diagnosis Date   Asthma    Hyperlipidemia    Hypertension    Umbilical hernia     Past Surgical History:  Procedure Laterality Date   DILATION AND CURETTAGE OF UTERUS     patient had this done for AUB   The following portions of the patient's history were reviewed and updated as appropriate: allergies, current medications, past family history, past medical history, past social history, past surgical history and problem list.   Health Maintenance:  Normal pap and negative HRHPV on 03/15/2018.  Normal mammogram on 10/29/2020.   Review of Systems:  Pertinent items noted in HPI and remainder of comprehensive ROS otherwise negative.  Physical Exam:  BP 138/74   Pulse 82   Wt (!) 330 lb (149.7 kg)   BMI 46.03 kg/m  CONSTITUTIONAL: Well-developed, well-nourished female in no acute distress.  HEENT:  Normocephalic, atraumatic. External right and left ear normal. No scleral icterus.  NECK: Normal range of motion, supple, no masses noted on observation SKIN: No rash noted. Not diaphoretic. No erythema. No pallor. MUSCULOSKELETAL: Normal range of motion. No edema noted. NEUROLOGIC: Alert and oriented to person, place, and time. Normal muscle tone coordination. No cranial nerve deficit noted. PSYCHIATRIC: Normal mood and affect. Normal behavior. Normal judgment and thought  content. CARDIOVASCULAR: Normal heart rate noted RESPIRATORY: Effort and breath sounds normal, no problems with respiration noted ABDOMEN: No masses noted. No other overt distention noted.  Morbid obesity. PELVIC: Deferred    Assessment and Plan:     1. Postmenopausal bleeding 2. Morbid obesity ([place]) 3. Thickened endometrium Discussed etiologies of postmenopausal bleeding, concern about precancerous/hyperplasia or cancerous etiology (5 to 10% percent of cases). Also discussed role of unopposed estrogen exposure in leading to thickened or proliferative endometrium; and its possible correlation with endometrial hyperplasia/carcinoma.  Discussed that obesity is linked to endometrial pathology given that adipose cells produce extra estrogen (estrone) which can cause the endometrium to have a significant amount of estrogen exposure.  However, she was reassured that benign etiologies are the most common causes of postmenopausal bleeding.  The primary goal in the diagnostic evaluation of postmenopausal women with uterine bleeding is to exclude malignancy; this can include endometrial biopsy or Hysteroscopy, D&C.  Hysteroscopy, Dilation and Curettage recommended given concern about inadequacy of endometrial biopsy.  No postmenopausal bleeding since December 2022.  Risks of surgery were discussed with the patient including but not limited to: bleeding which may require transfusion; infection which may require antibiotics; injury to uterus or surrounding organs; intrauterine scarring which may impair future fertility; need for additional procedures including laparotomy or laparoscopy; and other postoperative/anesthesia complications.  Patient was offered concurrent placement of progestin IUD to help counteract the thickened endometrium, she declined this for now but will consider it.  The postoperative expectations were also discussed in detail. The patient also understands the alternative treatment options which  were discussed in full. All questions were answered.  She was told  that she will be contacted by our surgical scheduler regarding the time and date of her surgery; routine preoperative instructions will be given to her by the preoperative nursing team.  Printed patient education handouts about the procedure were given to the patient to review at home.  Please refer to After Visit Summary for other counseling recommendations.   Return for any gynecologic concerns.    I spent 30 minutes dedicated to the care of this patient including pre-visit review of records, face to face time with the patient discussing her conditions and treatments.   Verita Schneiders, MD, [place] for [employer], [place]

## 2021-08-26 NOTE — Patient Instructions (Signed)
Consider placement of progesterone IUD such as Mirena, to help thin out uterine lining, protect uterus against endometrial cancer

## 2021-09-28 ENCOUNTER — Other Ambulatory Visit: Payer: Self-pay | Admitting: *Deleted

## 2021-09-28 DIAGNOSIS — I10 Essential (primary) hypertension: Secondary | ICD-10-CM

## 2021-09-28 DIAGNOSIS — J4540 Moderate persistent asthma, uncomplicated: Secondary | ICD-10-CM

## 2021-09-28 MED ORDER — MONTELUKAST SODIUM 10 MG PO TABS: 10.0000 mg | ORAL_TABLET | Freq: Every day | ORAL | 3 refills | Status: DC

## 2021-09-28 MED ORDER — HYDROCHLOROTHIAZIDE 25 MG PO TABS: 25.0000 mg | ORAL_TABLET | Freq: Every day | ORAL | 3 refills | Status: DC

## 2021-09-29 ENCOUNTER — Other Ambulatory Visit: Payer: Self-pay | Admitting: *Deleted

## 2021-09-29 DIAGNOSIS — E785 Hyperlipidemia, unspecified: Secondary | ICD-10-CM

## 2021-09-29 MED ORDER — ATORVASTATIN CALCIUM 80 MG PO TABS: 80.0000 mg | ORAL_TABLET | Freq: Every day | ORAL | 3 refills | Status: DC

## 2021-11-02 ENCOUNTER — Encounter (HOSPITAL_COMMUNITY): Payer: Self-pay | Admitting: Obstetrics & Gynecology

## 2021-11-02 ENCOUNTER — Other Ambulatory Visit: Payer: Self-pay

## 2021-11-02 NOTE — Progress Notes (Signed)
Spoke with pt for pre-op call. Pt denies cardiac history or Diabetes. Pt is treated for HTN.

## 2021-11-03 ENCOUNTER — Other Ambulatory Visit: Payer: Self-pay

## 2021-11-03 ENCOUNTER — Ambulatory Visit (HOSPITAL_COMMUNITY): Payer: 59 | Admitting: Certified Registered Nurse Anesthetist

## 2021-11-03 ENCOUNTER — Encounter (HOSPITAL_COMMUNITY)
Admission: RE | Disposition: A | Discharge status: Home / Self Care | Payer: Self-pay | Source: Ambulatory Visit | Attending: Obstetrics & Gynecology

## 2021-11-03 ENCOUNTER — Ambulatory Visit (HOSPITAL_COMMUNITY)
Admission: RE | Admit: 2021-11-03 | Discharge: 2021-11-03 | Disposition: A | Discharge status: Home / Self Care | Payer: 59 | Source: Ambulatory Visit | Attending: Obstetrics & Gynecology | Admitting: Obstetrics & Gynecology

## 2021-11-03 ENCOUNTER — Ambulatory Visit (HOSPITAL_BASED_OUTPATIENT_CLINIC_OR_DEPARTMENT_OTHER): Payer: 59 | Admitting: Certified Registered Nurse Anesthetist

## 2021-11-03 ENCOUNTER — Encounter (HOSPITAL_COMMUNITY): Payer: Self-pay | Admitting: Obstetrics & Gynecology

## 2021-11-03 DIAGNOSIS — K219 Gastro-esophageal reflux disease without esophagitis: Secondary | ICD-10-CM | POA: Insufficient documentation

## 2021-11-03 DIAGNOSIS — Z6841 Body Mass Index (BMI) 40.0 and over, adult: Secondary | ICD-10-CM | POA: Diagnosis not present

## 2021-11-03 DIAGNOSIS — N950 Postmenopausal bleeding: Secondary | ICD-10-CM | POA: Diagnosis not present

## 2021-11-03 DIAGNOSIS — R9389 Abnormal findings on diagnostic imaging of other specified body structures: Secondary | ICD-10-CM

## 2021-11-03 DIAGNOSIS — I10 Essential (primary) hypertension: Secondary | ICD-10-CM | POA: Diagnosis not present

## 2021-11-03 DIAGNOSIS — Z87891 Personal history of nicotine dependence: Secondary | ICD-10-CM | POA: Insufficient documentation

## 2021-11-03 DIAGNOSIS — J45909 Unspecified asthma, uncomplicated: Secondary | ICD-10-CM | POA: Insufficient documentation

## 2021-11-03 HISTORY — PX: DILATATION & CURETTAGE/HYSTEROSCOPY WITH MYOSURE: SHX6511

## 2021-11-03 HISTORY — DX: Gastro-esophageal reflux disease without esophagitis: K21.9

## 2021-11-03 HISTORY — DX: Pneumonia, unspecified organism: J18.9

## 2021-11-03 HISTORY — DX: Anemia, unspecified: D64.9

## 2021-11-03 LAB — CBC
HCT: 43 % (ref 36.0–46.0)
Hemoglobin: 14.1 g/dL (ref 12.0–15.0)
MCH: 27.9 pg (ref 26.0–34.0)
MCHC: 32.8 g/dL (ref 30.0–36.0)
MCV: 85 fL (ref 80.0–100.0)
Platelets: 195 10*3/uL (ref 150–400)
RBC: 5.06 MIL/uL (ref 3.87–5.11)
RDW: 14 % (ref 11.5–15.5)
WBC: 5.4 10*3/uL (ref 4.0–10.5)
nRBC: 0 % (ref 0.0–0.2)

## 2021-11-03 LAB — BASIC METABOLIC PANEL
Anion gap: 7 (ref 5–15)
BUN: 13 mg/dL (ref 6–20)
CO2: 31 mmol/L (ref 22–32)
Calcium: 9.5 mg/dL (ref 8.9–10.3)
Chloride: 102 mmol/L (ref 98–111)
Creatinine, Ser: 0.66 mg/dL (ref 0.44–1.00)
GFR, Estimated: >60 mL/min (ref >60)
Glucose, Bld: 96 mg/dL (ref 70–99)
Potassium: 3.4 mmol/L — ABNORMAL LOW (ref 3.5–5.1)
Sodium: 140 mmol/L (ref 135–145)

## 2021-11-03 SURGERY — DILATATION & CURETTAGE/HYSTEROSCOPY WITH MYOSURE
Anesthesia: General

## 2021-11-03 MED ORDER — ACETAMINOPHEN 500 MG PO TABS
ORAL_TABLET | ORAL | Status: AC
  Administered 2021-11-03: 1000 mg via ORAL
  Filled 2021-11-03: qty 2

## 2021-11-03 MED ORDER — ORAL CARE MOUTH RINSE: 15.0000 mL | Freq: Once | OROMUCOSAL | Status: AC

## 2021-11-03 MED ORDER — LIDOCAINE 2% (20 MG/ML) 5 ML SYRINGE
INTRAMUSCULAR | Status: DC | PRN
  Administered 2021-11-03: 60 mg via INTRAVENOUS

## 2021-11-03 MED ORDER — GABAPENTIN 300 MG PO CAPS: 300.0000 mg | ORAL_CAPSULE | ORAL | Status: AC

## 2021-11-03 MED ORDER — MIDAZOLAM HCL 5 MG/5ML IJ SOLN
INTRAMUSCULAR | Status: DC | PRN
  Administered 2021-11-03: 2 mg via INTRAVENOUS

## 2021-11-03 MED ORDER — DOCUSATE SODIUM 100 MG PO CAPS: 100.0000 mg | ORAL_CAPSULE | Freq: Two times a day (BID) | ORAL | 2 refills | Status: DC | PRN

## 2021-11-03 MED ORDER — LACTATED RINGERS IV SOLN
INTRAVENOUS | Status: DC
Start: 2021-11-03 — End: 2021-11-03

## 2021-11-03 MED ORDER — FENTANYL CITRATE (PF) 250 MCG/5ML IJ SOLN
INTRAMUSCULAR | Status: AC
  Filled 2021-11-03: qty 5

## 2021-11-03 MED ORDER — FENTANYL CITRATE (PF) 100 MCG/2ML IJ SOLN: 25.0000 ug | INTRAMUSCULAR | Status: DC | PRN

## 2021-11-03 MED ORDER — DEXAMETHASONE SODIUM PHOSPHATE 10 MG/ML IJ SOLN
INTRAMUSCULAR | Status: AC
  Filled 2021-11-03: qty 1

## 2021-11-03 MED ORDER — LACTATED RINGERS IV SOLN: INTRAVENOUS | Status: DC

## 2021-11-03 MED ORDER — PROPOFOL 10 MG/ML IV BOLUS
INTRAVENOUS | Status: DC | PRN
  Administered 2021-11-03: 300 mg via INTRAVENOUS

## 2021-11-03 MED ORDER — FENTANYL CITRATE (PF) 100 MCG/2ML IJ SOLN
INTRAMUSCULAR | Status: DC | PRN
  Administered 2021-11-03 (×2): 25 ug via INTRAVENOUS

## 2021-11-03 MED ORDER — LIDOCAINE 2% (20 MG/ML) 5 ML SYRINGE
INTRAMUSCULAR | Status: AC
  Filled 2021-11-03: qty 5

## 2021-11-03 MED ORDER — BUPIVACAINE HCL 0.5 % IJ SOLN
INTRAMUSCULAR | Status: DC | PRN
  Administered 2021-11-03: 30 mL

## 2021-11-03 MED ORDER — SODIUM CHLORIDE 0.9 % IR SOLN
Status: DC | PRN
  Administered 2021-11-03: 3000 mL

## 2021-11-03 MED ORDER — IBUPROFEN 600 MG PO TABS: 600.0000 mg | ORAL_TABLET | Freq: Four times a day (QID) | ORAL | 2 refills | Status: DC | PRN

## 2021-11-03 MED ORDER — CELECOXIB 200 MG PO CAPS: 400.0000 mg | ORAL_CAPSULE | ORAL | Status: AC

## 2021-11-03 MED ORDER — CHLORHEXIDINE GLUCONATE 0.12 % MT SOLN
OROMUCOSAL | Status: AC
  Administered 2021-11-03: 15 mL via OROMUCOSAL
  Filled 2021-11-03: qty 15

## 2021-11-03 MED ORDER — GABAPENTIN 300 MG PO CAPS
ORAL_CAPSULE | ORAL | Status: AC
  Administered 2021-11-03: 300 mg via ORAL
  Filled 2021-11-03: qty 1

## 2021-11-03 MED ORDER — KETOROLAC TROMETHAMINE 30 MG/ML IJ SOLN
INTRAMUSCULAR | Status: AC
  Filled 2021-11-03: qty 1

## 2021-11-03 MED ORDER — BUPIVACAINE HCL 0.5 % IJ SOLN
INTRAMUSCULAR | Status: AC
  Filled 2021-11-03: qty 1

## 2021-11-03 MED ORDER — CELECOXIB 200 MG PO CAPS
ORAL_CAPSULE | ORAL | Status: AC
  Administered 2021-11-03: 400 mg via ORAL
  Filled 2021-11-03: qty 1

## 2021-11-03 MED ORDER — MIDAZOLAM HCL 2 MG/2ML IJ SOLN
INTRAMUSCULAR | Status: AC
  Filled 2021-11-03: qty 2

## 2021-11-03 MED ORDER — BUPIVACAINE HCL (PF) 0.5 % IJ SOLN
INTRAMUSCULAR | Status: AC
  Filled 2021-11-03: qty 30

## 2021-11-03 MED ORDER — ACETAMINOPHEN 500 MG PO TABS: 1000.0000 mg | ORAL_TABLET | ORAL | Status: AC

## 2021-11-03 MED ORDER — EPHEDRINE 5 MG/ML INJ
INTRAVENOUS | Status: AC
  Filled 2021-11-03: qty 5

## 2021-11-03 MED ORDER — OXYCODONE HCL 5 MG PO TABS: 5.0000 mg | ORAL_TABLET | Freq: Four times a day (QID) | ORAL | 0 refills | Status: DC | PRN

## 2021-11-03 MED ORDER — ONDANSETRON HCL 4 MG/2ML IJ SOLN
INTRAMUSCULAR | Status: AC
  Filled 2021-11-03: qty 2

## 2021-11-03 MED ORDER — CHLORHEXIDINE GLUCONATE 0.12 % MT SOLN: 15.0000 mL | Freq: Once | OROMUCOSAL | Status: AC

## 2021-11-03 MED ORDER — CELECOXIB 200 MG PO CAPS
ORAL_CAPSULE | ORAL | Status: AC
  Filled 2021-11-03: qty 1

## 2021-11-03 MED ORDER — DEXAMETHASONE SODIUM PHOSPHATE 4 MG/ML IJ SOLN
INTRAMUSCULAR | Status: DC | PRN
  Administered 2021-11-03: 5 mg via INTRAVENOUS

## 2021-11-03 MED ORDER — ONDANSETRON HCL 4 MG/2ML IJ SOLN
INTRAMUSCULAR | Status: DC | PRN
  Administered 2021-11-03: 4 mg via INTRAVENOUS

## 2021-11-03 MED ORDER — PROPOFOL 10 MG/ML IV BOLUS
INTRAVENOUS | Status: AC
  Filled 2021-11-03: qty 20

## 2021-11-03 SURGICAL SUPPLY — 15 items
CANISTER SUCT 3000ML PPV (MISCELLANEOUS) ×1 IMPLANT
CATH ROBINSON RED A/P 16FR (CATHETERS) ×1 IMPLANT
CURETTE PIPELLE ENDOMTRL SUCTN (MISCELLANEOUS) IMPLANT
GLOVE ECLIPSE 7.0 STRL STRAW (GLOVE) ×1 IMPLANT
GLOVE SURG UNDER POLY LF SZ7 (GLOVE) ×2 IMPLANT
GOWN STRL REUS W/ TWL LRG LVL3 (GOWN DISPOSABLE) ×2 IMPLANT
GOWN STRL REUS W/TWL LRG LVL3 (GOWN DISPOSABLE) ×2
KIT PROCEDURE FLUENT (KITS) ×1 IMPLANT
KIT TURNOVER KIT B (KITS) ×1 IMPLANT
PACK VAGINAL MINOR WOMEN LF (CUSTOM PROCEDURE TRAY) ×1 IMPLANT
PAD OB MATERNITY 4.3X12.25 (PERSONAL CARE ITEMS) ×1 IMPLANT
PIPELLE ENDOMETRIAL SUCTION CU (MISCELLANEOUS) ×1
SEAL ROD LENS SCOPE MYOSURE (ABLATOR) ×1 IMPLANT
TOWEL GREEN STERILE FF (TOWEL DISPOSABLE) ×2 IMPLANT
UNDERPAD 30X36 HEAVY ABSORB (UNDERPADS AND DIAPERS) ×1 IMPLANT

## 2021-11-03 NOTE — Anesthesia Procedure Notes (Signed)
Procedure Name: LMA Insertion Date/Time: 11/03/2021 11:59 AM  Performed by: Michele Rockers, CRNAPre-anesthesia Checklist: Patient identified, Patient being monitored, Timeout performed, Emergency Drugs available and Suction available Patient Re-evaluated:Patient Re-evaluated prior to induction Oxygen Delivery Method: Circle system utilized Preoxygenation: Pre-oxygenation with 100% oxygen Induction Type: IV induction Ventilation: Mask ventilation without difficulty LMA Size: 4.0 Number of attempts: 1 Placement Confirmation: positive ETCO2 and breath sounds checked- equal and bilateral Tube secured with: Tape Dental Injury: Teeth and Oropharynx as per pre-operative assessment

## 2021-11-03 NOTE — Anesthesia Preprocedure Evaluation (Addendum)
Anesthesia Evaluation  Patient identified by MRN, date of birth, ID band Patient awake    Reviewed: Allergy & Precautions, H&P , NPO status , Patient's Chart, lab work & pertinent test results  Airway Mallampati: II  TM Distance: >3 FB Neck ROM: Full    Dental no notable dental hx. (+) Teeth Intact, Dental Advisory Given   Pulmonary asthma , former smoker,    Pulmonary exam normal breath sounds clear to auscultation       Cardiovascular hypertension, Pt. on medications  Rhythm:Regular Rate:Normal     Neuro/Psych negative neurological ROS  negative psych ROS   GI/Hepatic Neg liver ROS, GERD  Medicated,  Endo/Other  Morbid obesity  Renal/GU negative Renal ROS  negative genitourinary   Musculoskeletal   Abdominal   Peds  Hematology  (+) Blood dyscrasia, anemia ,   Anesthesia Other Findings   Reproductive/Obstetrics negative OB ROS                            Anesthesia Physical Anesthesia Plan  ASA: 3  Anesthesia Plan: General   Post-op Pain Management: Tylenol PO (pre-op)* and Celebrex PO (pre-op)*   Induction: Intravenous  PONV Risk Score and Plan: 4 or greater and Ondansetron, Dexamethasone and Midazolam  Airway Management Planned: LMA  Additional Equipment:   Intra-op Plan:   Post-operative Plan: Extubation in OR  Informed Consent: I have reviewed the patients History and Physical, chart, labs and discussed the procedure including the risks, benefits and alternatives for the proposed anesthesia with the patient or authorized representative who has indicated his/her understanding and acceptance.     Dental advisory given  Plan Discussed with: CRNA  Anesthesia Plan Comments:        Anesthesia Quick Evaluation

## 2021-11-03 NOTE — Transfer of Care (Signed)
Immediate Anesthesia Transfer of Care Note  Patient: Yvonne Turner  Procedure(s) Performed: DILATATION & CURETTAGE/HYSTEROSCOPY  Patient Location: PACU  Anesthesia Type:General  Level of Consciousness: drowsy, patient cooperative and responds to stimulation  Airway & Oxygen Therapy: Patient Spontanous Breathing and Patient connected to nasal cannula oxygen  Post-op Assessment: Report given to RN, Post -op Vital signs reviewed and stable and Patient moving all extremities X 4  Post vital signs: Reviewed and stable  Last Vitals:  Vitals Value Taken Time  BP 126/80 11/03/21 1238  Temp    Pulse 70 11/03/21 1242  Resp 17 11/03/21 1242  SpO2 98 % 11/03/21 1242  Vitals shown include unvalidated device data.  Last Pain:  Vitals:   11/03/21 0909  TempSrc:   PainSc: 0-No pain         Complications: No notable events documented.

## 2021-11-03 NOTE — Op Note (Signed)
PREOPERATIVE DIAGNOSIS:  Postmenopausal bleeding POSTOPERATIVE DIAGNOSIS: The same PROCEDURE: Hysteroscopy, Dilation and Curettage. SURGEON:  Dr. Verita Schneiders   INDICATIONS: 56 y.o. [id number]  here for scheduled surgery for the aforementioned diagnoses.   Risks of surgery were discussed with the patient including but not limited to: bleeding which may require transfusion; infection which may require antibiotics; injury to uterus or surrounding organs; obtaining inadequate specimeny; need for additional procedures including laparotomy or laparoscopy; and other postoperative/anesthesia complications. Written informed consent was obtained.    FINDINGS:  A 11 week size uterus.  Diffuse atrophic endometrium with less than 1 cm submucosal fibroid in upper fundus. It was difficult to get curettings due to atrophic nature of endometrium. Normal ostia bilaterally.  ANESTHESIA:   General, paracervical block with 30 ml of 0.5% Marcaine ESTIMATED BLOOD LOSS:  30 ml SPECIMENS: Endometrial curettings sent to pathology COMPLICATIONS:  None immediate.  PROCEDURE DETAILS:  The patient was then taken to the operating room where general anesthesia was administered and was found to be adequate.  After an adequate timeout was performed, she was placed in the dorsal lithotomy position and examined; then prepped and draped in the sterile manner.   Her bladder was catheterized for an unmeasured amount of clear, yellow urine. A speculum was then placed in the patient's vagina and a single tooth tenaculum was applied to the anterior lip of the cervix.   A paracervical block using 30 ml of 0.5% Marcaine was administered.  The uterus was sounded to 11 cm and the cervix was dilated manually with metal dilators to accommodate the 5 mm diagnostic hysteroscope.  The hysteroscope was then inserted under direct visualization using NS as a suspension medium.  The uterine cavity was carefully examined with the findings as noted above.    After further careful visualization of the uterine cavity, the hysteroscope was removed under direct visualization.  A sharp curettage was then performed to obtain a small amount of endometrial curettings.  The tenaculum was removed from the anterior lip of the cervix and the vaginal speculum was removed after noting good hemostasis.  The patient tolerated the procedure well and was taken to the recovery area awake, extubated and in stable condition.  The patient will be discharged to home as per PACU criteria.  Routine postoperative instructions given.  She was prescribed Oxycodone, Ibuprofen and Colace.  She will follow up in the office in 2-3 weeks for postoperative evaluation.   Verita Schneiders, MD, [place] for [employer], [place]

## 2021-11-03 NOTE — H&P (Signed)
Preoperative History and Physical  Yvonne Turner is a 56 y.o. [phone] here for surgical evaluation of postmenopausal bleeding.  Ultrasound done 03/31/2021 showed 16.4 mm stripe and endometrial biopsy on  03/24/2021 showed scant benign inactive endometrium, negative for hyperplasia or malignancy.  There was concern about inadequacy of this specimen, so Hysteroscopy, D&C was recommended.  She denies any bleeding since last episode in December 2022. Denies any abnormal vaginal discharge, bleeding, pelvic pain or other concerns. No significant preoperative concerns.  Proposed surgery: Hysteroscopy, Dilation and Curettage   Past Medical History:  Diagnosis Date   Anemia    Asthma    COVID 2023   mild case (early 2023)   GERD (gastroesophageal reflux disease)    Hyperlipidemia    Hypertension    Pneumonia    Umbilical hernia    Past Surgical History:  Procedure Laterality Date   DILATION AND CURETTAGE OF UTERUS     patient had this done for AUB   OB History  Gravida Para Term Preterm AB Living  '4 3 3   1 3  '$ SAB IAB Ectopic Multiple Live Births  1            # Outcome Date GA Lbr Len/2nd Weight Sex Delivery Anes PTL Lv  4 SAB           3 Term           2 Term           1 Term           Patient denies any other pertinent gynecologic issues.   No current facility-administered medications on file prior to encounter.   Current Outpatient Medications on File Prior to Encounter  Medication Sig Dispense Refill   albuterol (VENTOLIN HFA) 108 (90 Base) MCG/ACT inhaler Inhale 2 puffs into the lungs every 6 (six) hours as needed for wheezing or shortness of breath. 8 g 2   fluticasone furoate-vilanterol (BREO ELLIPTA) 100-25 MCG/ACT AEPB Inhale 1 puff into the lungs daily. 60 each 2   Multiple Vitamin (DAILY-VITE MULTIVITAMIN) TABS Take 1 tablet by mouth daily. 90 tablet 1   albuterol (ACCUNEB) 1.25 MG/3ML nebulizer solution SMARTSIG:1 Via Nebulizer 4 Times Daily 75 mL 6   latanoprost  (XALATAN) 0.005 % ophthalmic solution Place 1 drop into both eyes at bedtime.     naproxen (NAPROSYN) 375 MG tablet Take 1 tablet (375 mg total) by mouth 2 (two) times daily. 20 tablet 0   VITAMIN D HIGH POTENCY 25 MCG (1000 UT) capsule Take 1 capsule (1,000 Units total) by mouth daily. 90 capsule 1   No Known Allergies  Social History:   reports that she quit smoking about 29 years ago. Her smoking use included cigarettes. She started smoking about 49 years ago. She has a 10.00 pack-year smoking history. She has never used smokeless tobacco. She reports that she does not drink alcohol and does not use drugs.  Family History  Problem Relation Age of Onset   Pancreatic cancer Mother    Diabetes type II Father     Review of Systems: Pertinent items noted in HPI and remainder of comprehensive ROS otherwise negative.  PHYSICAL EXAM: Blood pressure 131/84, pulse 83, temperature 98.3 F (36.8 C), temperature source Oral, resp. rate 18, height '5\' 11"'$  (1.803 m), weight (!) 149.7 kg, SpO2 97 %. CONSTITUTIONAL: Well-developed, well-nourished female in no acute distress.  HENT:  Normocephalic, atraumatic, External right and left ear normal. Oropharynx is clear and moist  EYES: Conjunctivae and EOM are normal. Pupils are equal, round, and reactive to light. No scleral icterus.  NECK: Normal range of motion, supple, no masses SKIN: Skin is warm and dry. No rash noted. Not diaphoretic. No erythema. No pallor. NEUROLOGIC: Alert and oriented to person, place, and time. Normal reflexes, muscle tone coordination. No cranial nerve deficit noted. PSYCHIATRIC: Normal mood and affect. Normal behavior. Normal judgment and thought content. CARDIOVASCULAR: Normal heart rate noted, regular rhythm RESPIRATORY: Effort and breath sounds normal, no problems with respiration noted ABDOMEN: Soft, nontender, nondistended. PELVIC: Deferred MUSCULOSKELETAL: Normal range of motion. No edema and no tenderness. 2+ distal  pulses.  Labs: Results for orders placed or performed during the hospital encounter of 11/03/21 (from the past 336 hour(s))  CBC   Collection Time: 11/03/21  9:00 AM  Result Value Ref Range   WBC 5.4 4.0 - 10.5 K/uL   RBC 5.06 3.87 - 5.11 MIL/uL   Hemoglobin 14.1 12.0 - 15.0 g/dL   HCT 43.0 36.0 - 46.0 %   MCV 85.0 80.0 - 100.0 fL   MCH 27.9 26.0 - 34.0 pg   MCHC 32.8 30.0 - 36.0 g/dL   RDW 14.0 11.5 - 15.5 %   Platelets 195 150 - 400 K/uL   nRBC 0.0 0.0 - 0.2 %  Basic metabolic panel   Collection Time: 11/03/21  9:00 AM  Result Value Ref Range   Sodium 140 135 - 145 mmol/L   Potassium 3.4 (L) 3.5 - 5.1 mmol/L   Chloride 102 98 - 111 mmol/L   CO2 31 22 - 32 mmol/L   Glucose, Bld 96 70 - 99 mg/dL   BUN 13 6 - 20 mg/dL   Creatinine, Ser 0.66 0.44 - 1.00 mg/dL   Calcium 9.5 8.9 - 10.3 mg/dL   GFR, Estimated >60 >60 mL/min   Anion gap 7 5 - 15    Assessment: Patient Active Problem List   Diagnosis Date Noted   Postmenopausal bleeding 11/03/2021   Morbid obesity ([place]) 05/30/2021   Lower back pain 09/30/2020   Asthma 07/05/2020   Hyperlipidemia 07/05/2020   Vitamin D deficiency 07/05/2020   Hypertension 07/05/2020    Plan: Patient will undergo surgical management with Hysteroscopy, Dilation and Curettage .   The risks of surgery were discussed in detail with the patient including but not limited to: bleeding which may require transfusion; infection which may require antibiotics; injury to uterus or surrounding organs; obtaining inadequate specimen; need for additional procedures including; and other postoperative/anesthesia complications.  Patient was offered concurrent placement of progestin IUD to help counteract the thickened endometrium, she declined this. Likelihood of success in alleviating the patient's condition was discussed. Routine postoperative instructions will be reviewed with the patient and her family in detail after surgery.  The patient concurred with the  proposed plan, giving informed written consent for the surgery.  Patient has been NPO since last night and she will remain NPO for procedure.  Anesthesia and OR aware.    To OR when ready.    Verita Schneiders, MD, [place] for [employer], [place]

## 2021-11-04 ENCOUNTER — Encounter (HOSPITAL_COMMUNITY): Payer: Self-pay | Admitting: Obstetrics & Gynecology

## 2021-11-04 NOTE — Anesthesia Postprocedure Evaluation (Signed)
Anesthesia Post Note  Patient: Yvonne Turner  Procedure(s) Performed: DILATATION & CURETTAGE/HYSTEROSCOPY     Patient location during evaluation: PACU Anesthesia Type: General Level of consciousness: awake and alert Pain management: pain level controlled Vital Signs Assessment: post-procedure vital signs reviewed and stable Respiratory status: spontaneous breathing, nonlabored ventilation and respiratory function stable Cardiovascular status: blood pressure returned to baseline and stable Postop Assessment: no apparent nausea or vomiting Anesthetic complications: no   No notable events documented.  Last Vitals:  Vitals:   11/03/21 1300 11/03/21 1315  BP: (!) 140/81 (!) 140/81  Pulse: 66 76  Resp: 14 16  Temp:  36.5 C  SpO2: 99% 100%    Last Pain:  Vitals:   11/03/21 1315  TempSrc:   PainSc: 0-No pain                 Juanangel Soderholm,W. EDMOND

## 2021-11-08 LAB — SURGICAL PATHOLOGY

## 2021-11-09 ENCOUNTER — Telehealth: Payer: Self-pay | Admitting: General Practice

## 2021-11-09 NOTE — Telephone Encounter (Signed)
Called patient, no answer- left message stating the results from her recent visit were normal. She may call us back if she has any questions.

## 2021-11-09 NOTE — Telephone Encounter (Signed)
-----   Message from Osborne Oman, MD sent at 11/08/2021  5:44 PM EDT ----- Benign endometrial pathology.  Please call to inform patient of results.

## 2021-11-24 ENCOUNTER — Ambulatory Visit: Payer: 59 | Admitting: Obstetrics & Gynecology

## 2021-11-24 ENCOUNTER — Encounter: Payer: Self-pay | Admitting: Obstetrics & Gynecology

## 2021-11-24 VITALS — BP 115/75 | HR 76

## 2021-11-24 DIAGNOSIS — Z23 Encounter for immunization: Secondary | ICD-10-CM

## 2021-11-24 DIAGNOSIS — Z09 Encounter for follow-up examination after completed treatment for conditions other than malignant neoplasm: Secondary | ICD-10-CM

## 2021-11-24 NOTE — Progress Notes (Signed)
   GYNECOLOGY POSTOPERATIVE VISIT   Subjective:     Yvonne Turner is a 56 y.o.  [phone] female who presents to the clinic 3 weeks status post Hysteroscopy, Dilation and Curettage for Postmenopausal bleeding. Eating a regular diet without difficulty. Bowel movements are normal. The patient is not having any pain.  No bleeding since procedure.  Patient desires flu vaccine.  The following portions of the patient's history were reviewed and updated as appropriate: allergies, current medications, past family history, past medical history, past social history, past surgical history, and problem list.  Normal pap and negative HRHPV on 04/03/2021. Benign mammogram on 10/29/2021.   Review of Systems Pertinent items noted in HPI and remainder of comprehensive ROS otherwise negative.    Objective:    BP 115/75   Pulse 76  General:  alert and no distress  Abdomen: soft, bowel sounds active, non-tender  Pelvic:   deferred   11/03/21  Surgical Pathology ENDOMETRIUM, CURETTAGE:  - Benign inactive endometrium with thermal artifact, see comment  - Benign squamous mucosa  - Negative for hyperplasia or malignancy     Assessment:    Doing well postoperatively. Operative findings again reviewed. Pathology report discussed.    Plan:    1. Continue any current medications. 2. Wound care discussed. 3. Activity restrictions: none 4. Flu shot given today as per patient's request. 5. Follow up: as needed.    Verita Schneiders, MD, [place] for [employer], [place]

## 2021-11-25 ENCOUNTER — Other Ambulatory Visit: Payer: Self-pay | Admitting: Internal Medicine

## 2021-11-25 ENCOUNTER — Other Ambulatory Visit: Payer: Self-pay | Admitting: Student

## 2021-11-25 DIAGNOSIS — Z1231 Encounter for screening mammogram for malignant neoplasm of breast: Secondary | ICD-10-CM

## 2021-11-25 DIAGNOSIS — J4540 Moderate persistent asthma, uncomplicated: Secondary | ICD-10-CM

## 2021-11-30 ENCOUNTER — Ambulatory Visit
Admission: RE | Admit: 2021-11-30 | Discharge: 2021-11-30 | Disposition: A | Discharge status: Home / Self Care | Payer: 59 | Source: Ambulatory Visit | Attending: Internal Medicine | Admitting: Internal Medicine

## 2021-11-30 DIAGNOSIS — Z1231 Encounter for screening mammogram for malignant neoplasm of breast: Secondary | ICD-10-CM

## 2022-01-06 ENCOUNTER — Ambulatory Visit: Payer: 59 | Admitting: Podiatry

## 2022-01-06 ENCOUNTER — Encounter: Payer: Self-pay | Admitting: Podiatry

## 2022-01-06 DIAGNOSIS — H40019 Open angle with borderline findings, low risk, unspecified eye: Secondary | ICD-10-CM | POA: Insufficient documentation

## 2022-01-06 DIAGNOSIS — K429 Umbilical hernia without obstruction or gangrene: Secondary | ICD-10-CM | POA: Insufficient documentation

## 2022-01-06 DIAGNOSIS — D282 Benign neoplasm of uterine tubes and ligaments: Secondary | ICD-10-CM | POA: Insufficient documentation

## 2022-01-06 DIAGNOSIS — D2371 Other benign neoplasm of skin of right lower limb, including hip: Secondary | ICD-10-CM

## 2022-01-06 DIAGNOSIS — E7800 Pure hypercholesterolemia, unspecified: Secondary | ICD-10-CM | POA: Insufficient documentation

## 2022-01-06 DIAGNOSIS — D3500 Benign neoplasm of unspecified adrenal gland: Secondary | ICD-10-CM | POA: Insufficient documentation

## 2022-01-06 DIAGNOSIS — D649 Anemia, unspecified: Secondary | ICD-10-CM | POA: Insufficient documentation

## 2022-01-06 DIAGNOSIS — K219 Gastro-esophageal reflux disease without esophagitis: Secondary | ICD-10-CM | POA: Insufficient documentation

## 2022-01-06 DIAGNOSIS — Z87891 Personal history of nicotine dependence: Secondary | ICD-10-CM | POA: Insufficient documentation

## 2022-01-06 DIAGNOSIS — E041 Nontoxic single thyroid nodule: Secondary | ICD-10-CM | POA: Insufficient documentation

## 2022-01-06 DIAGNOSIS — N920 Excessive and frequent menstruation with regular cycle: Secondary | ICD-10-CM | POA: Insufficient documentation

## 2022-01-06 DIAGNOSIS — K449 Diaphragmatic hernia without obstruction or gangrene: Secondary | ICD-10-CM | POA: Insufficient documentation

## 2022-01-06 DIAGNOSIS — I517 Cardiomegaly: Secondary | ICD-10-CM | POA: Insufficient documentation

## 2022-01-06 NOTE — Progress Notes (Signed)
Subjective:  Patient ID: Yvonne Turner, female    DOB: [birth year],  MRN: [id number] HPI Chief Complaint  Patient presents with   Callouses    Plantar forefoot right - callused area x several months, usually gets trimmed at the salon, but when it gets really thick the pressure of it hurts to walk   New Patient (Initial Visit)    56 y.o. female presents with the above complaint.   ROS: Denies fever chills nausea vomit muscle aches pains calf pain back pain chest pain shortness of breath.  Past Medical History:  Diagnosis Date   Anemia    Asthma    COVID 2023   mild case (early 2023)   GERD (gastroesophageal reflux disease)    Hyperlipidemia    Hypertension    Pneumonia    Umbilical hernia    Past Surgical History:  Procedure Laterality Date   DILATATION & CURETTAGE/HYSTEROSCOPY WITH MYOSURE N/A 11/03/2021   Procedure: DILATATION & CURETTAGE/HYSTEROSCOPY;  Surgeon: Osborne Oman, MD;  Location: [facility];  Service: Gynecology;  Laterality: N/A;  rep will be here confirmed on 9/18 CS   DILATION AND CURETTAGE OF UTERUS     patient had this done for AUB    Current Outpatient Medications:    timolol (TIMOPTIC) 0.5 % ophthalmic solution, 1 drop every morning., Disp: , Rfl:    albuterol (VENTOLIN HFA) 108 (90 Base) MCG/ACT inhaler, Inhale 2 puffs into the lungs every 6 (six) hours as needed for wheezing or shortness of breath., Disp: 8 g, Rfl: 2   atorvastatin (LIPITOR) 80 MG tablet, Take 1 tablet (80 mg total) by mouth daily., Disp: 90 tablet, Rfl: 3   BREO ELLIPTA 100-25 MCG/ACT AEPB, INHALE 1 PUFF INTO THE LUNGS DAILY, Disp: 60 each, Rfl: 2   docusate sodium (COLACE) 100 MG capsule, Take 1 capsule (100 mg total) by mouth 2 (two) times daily as needed for mild constipation or moderate constipation., Disp: 30 capsule, Rfl: 2   hydrochlorothiazide (HYDRODIURIL) 25 MG tablet, Take 1 tablet (25 mg total) by mouth daily., Disp: 90 tablet, Rfl: 3   ibuprofen (ADVIL) 600 MG tablet, Take 1  tablet (600 mg total) by mouth every 6 (six) hours as needed for headache, mild pain, moderate pain or cramping., Disp: 30 tablet, Rfl: 2   montelukast (SINGULAIR) 10 MG tablet, Take 1 tablet (10 mg total) by mouth daily., Disp: 90 tablet, Rfl: 3   Multiple Vitamin (DAILY-VITE MULTIVITAMIN) TABS, Take 1 tablet by mouth daily., Disp: 90 tablet, Rfl: 1   oxyCODONE (OXY IR/ROXICODONE) 5 MG immediate release tablet, Take 1 tablet (5 mg total) by mouth every 6 (six) hours as needed for severe pain or breakthrough pain., Disp: 10 tablet, Rfl: 0  No Known Allergies Review of Systems Objective:  There were no vitals filed for this visit.  General: Well developed, nourished, in no acute distress, alert and oriented x3   Dermatological: Skin is warm, dry and supple bilateral. Nails x 10 are well maintained; remaining integument appears unremarkable at this time. There are no open sores, no preulcerative lesions, no rash or signs of infection present.  Tyloma to the plantar aspect of the right.  Benign skin lesion no open lesions or wounds.  Vascular: Dorsalis Pedis artery and Posterior Tibial artery pedal pulses are 2/4 bilateral with immedate capillary fill time. Pedal hair growth present. No varicosities and no lower extremity edema present bilateral.   Neruologic: Grossly intact via light touch bilateral. Vibratory intact via tuning fork bilateral.  Protective threshold with Semmes Wienstein monofilament intact to all pedal sites bilateral. Patellar and Achilles deep tendon reflexes 2+ bilateral. No Babinski or clonus noted bilateral.   Musculoskeletal: No gross boney pedal deformities bilateral. No pain, crepitus, or limitation noted with foot and ankle range of motion bilateral. Muscular strength 5/5 in all groups tested bilateral.  Gait: Unassisted, Nonantalgic.    Radiographs:  None taken  Assessment & Plan:   Assessment: Benign skin lesion subthird fourth metatarsal head right  foot.  Plan: Mechanical debrided and padded plantar aspect of forefoot.  Follow-up with her as needed basis     Deagen Krass T. [place], [place]

## 2022-02-21 ENCOUNTER — Telehealth: Payer: Self-pay | Admitting: Pulmonary Disease

## 2022-02-21 DIAGNOSIS — J4540 Moderate persistent asthma, uncomplicated: Secondary | ICD-10-CM

## 2022-02-21 MED ORDER — FLUTICASONE FUROATE-VILANTEROL 100-25 MCG/ACT IN AEPB: 1.0000 | INHALATION_SPRAY | Freq: Every day | RESPIRATORY_TRACT | 2 refills | Status: DC

## 2022-02-21 NOTE — Telephone Encounter (Signed)
Called and spoke to patient and verified her pharmacy for her refills. She has office visit next month and I told her to make sure she keeps her visit. Nothing further needed

## 2022-02-24 NOTE — Progress Notes (Signed)
CC: Follow-up  HPI:   [name] is a 57 y.o. female with a past medical history of asthma, hypertension, hyperlipidemia, and anemia who presents for routine follow-up. She was last seen at [facility] in 08-2021. About four months ago on 11-03-2021, she underwent dilation and curettage for postmenopausal bleeding and thickened endometrial lining.    Past Medical History:  Diagnosis Date   Anemia    Asthma    COVID 2023   mild case (early 2023)   GERD (gastroesophageal reflux disease)    Hyperlipidemia    Hypertension    Pneumonia    Umbilical hernia      Review of Systems:    Reports feeling good overall Denies fever, recent illnesses, dizziness, lightheadedness, headache, blurry vision, abdominal pain, bloating, nausea   Physical Exam:  Vitals:   02/28/22 0855 02/28/22 0934  BP: (!) 145/91 (!) 153/84  Pulse: 69 64  Temp: 97.7 F (36.5 C)   TempSrc: Oral   SpO2: 100%   Weight: (!) 338 lb 3.2 oz (153.4 kg)     General:   awake and alert, sitting comfortably in chair, cooperative, not in acute distress Skin:   warm and dry, intact without any obvious lesions or scars, no rashes or lesions  Head:   normocephalic and atraumatic, oral mucosa moist Eyes:   extraocular movements intact, conjunctivae pink, pupils round Lungs:   normal respiratory effort, breathing unlabored, symmetrical chest rise, no crackles or wheezing Cardiac:   regular rate and rhythm, normal S1 and S2 Abdomen:   soft and non-distended, no visible obtrusion of umbilicus Musculoskeletal:   motor strength 5 /5 in all four extremities, no obvious deformities or joint tenderness Neurologic:   oriented to person-place-time, moving all extremities Psychiatric:   euthymic mood with congruent affect, intelligible speech    Assessment & Plan:   Hypertension Patient has a history of hypertension managed with hydrochlorothiazide, which she takes daily. Today in clinic, her BP was 145/91 with repeat  measurement 153/84. She denies dizziness, lightheadedness, blurry vision, and headache. Has a cuff at home, but does not regularly check her blood pressure. Most recent BMP in 10-2021 revealed potassium level of 3.4.   - Continue to take hydrochlorothiazide '25mg'$  q24 - Encourage daily blood pressure measurements at home - Return to clinic in three months, consider adding triamterene if blood pressure remains high    Hyperlipidemia Patient has history of hyperlipidemia managed at home with maximum recommended atorvastatin dose daily. Most recent lipid panel in 08-2021 demonstrated LDL 115, which is above our goal of <100. Previously, patient had been prescribed semaglutide but she never started out of aversion to its injectable administration route. Overall, patient strongly prefers to manage her cholesterol with lifestyle changes rather than medications. She has, however, gained 8lbs since 10-2021 and never utilized our previous referral to a physician assistance exercise program.  - Encourage lifestyle modifications including regular exercise and limiting intake of fatty foods  - Recheck lipid panel, consider starting ezetimibe if LDL elevated     Umbilical hernia Patient has an umbilical hernia for many years, believes that it has stayed about the same size. Denies pain, discomfort, or protrusion from the umbilicus. Per electronic medical record, it contains incarcerated fat. On examination, there was no visible protrusion with bearing down. Patient has expressed the desire to speak with a general surgeon about treatment options.  - Referral to general surgery for discussion of treatment options      See Encounters Tab for problem  based charting.  Patient discussed with Dr. Heber [place]

## 2022-02-28 ENCOUNTER — Encounter: Payer: Self-pay | Admitting: Student

## 2022-02-28 ENCOUNTER — Ambulatory Visit: Payer: 59 | Admitting: Student

## 2022-02-28 VITALS — BP 153/84 | HR 64 | Temp 97.7°F | Wt 338.2 lb

## 2022-02-28 DIAGNOSIS — I10 Essential (primary) hypertension: Secondary | ICD-10-CM

## 2022-02-28 DIAGNOSIS — Z87891 Personal history of nicotine dependence: Secondary | ICD-10-CM | POA: Diagnosis not present

## 2022-02-28 DIAGNOSIS — E785 Hyperlipidemia, unspecified: Secondary | ICD-10-CM

## 2022-02-28 DIAGNOSIS — K429 Umbilical hernia without obstruction or gangrene: Secondary | ICD-10-CM | POA: Diagnosis not present

## 2022-02-28 NOTE — Assessment & Plan Note (Signed)
Patient has an umbilical hernia for many years, believes that it has stayed about the same size. Denies pain, discomfort, or protrusion from the umbilicus. Per electronic medical record, it contains incarcerated fat. On examination, there was no visible protrusion with bearing down. Patient has expressed the desire to speak with a general surgeon about treatment options.  - Referral to general surgery for discussion of treatment options

## 2022-02-28 NOTE — Assessment & Plan Note (Signed)
Patient has a history of hypertension managed with hydrochlorothiazide, which she takes daily. Today in clinic, her BP was 145/91 with repeat measurement 153/84. She denies dizziness, lightheadedness, blurry vision, and headache. Has a cuff at home, but does not regularly check her blood pressure. Most recent BMP in 10-2021 revealed potassium level of 3.4.   - Continue to take hydrochlorothiazide '25mg'$  q24 - Encourage daily blood pressure measurements at home - Return to clinic in three months, consider adding triamterene if blood pressure remains high

## 2022-02-28 NOTE — Patient Instructions (Signed)
  Thank you, [name], for allowing [place] to provide your care today. Today we discussed . . .  > Hypertension       - continue to take your blood pressure medication hydrochlorothiazide daily       - please start to check your blood pressure at home daily with your cuff > Hyperlipidemia       - your LDL cholesterol levels were high when they were last checked in September       - we are checking your cholesterol levels again today and will call you with the results       - please set dietary and exercise goals for yourself to lose weight while eating lower-fat foods > Hernia       - we are sending you a referral to general surgery for evaluation of your umbilical hernia   I have ordered the following labs for you:  Lab Orders         Lipid Profile       Tests ordered today:  none   Referrals ordered today:   Referral Orders         Ambulatory referral to General Surgery        I have ordered the following medication/changed the following medications:   Stop the following medications: There are no discontinued medications.   Start the following medications: No orders of the defined types were placed in this encounter.     Follow up: 3 months    Remember:  Continue to take hydrochlorothiazide and start checking your blood pressure at home. Please start exercising more frequently and reducing the amount of fried or fatty foods that you eat. Remember: vegetables are your best friend! We will call you with your lab results and see you again in about 3 months!   Should you have any questions or concerns please call the internal medicine clinic at [phone].     Yvonne Nickel, MD [place]

## 2022-02-28 NOTE — Assessment & Plan Note (Signed)
Patient has history of hyperlipidemia managed at home with maximum recommended atorvastatin dose daily. Most recent lipid panel in 08-2021 demonstrated LDL 115, which is above our goal of <100. Previously, patient had been prescribed semaglutide but she never started out of aversion to its injectable administration route. Overall, patient strongly prefers to manage her cholesterol with lifestyle changes rather than medications. She has, however, gained 8lbs since 10-2021 and never utilized our previous referral to a physician assistance exercise program.  - Encourage lifestyle modifications including regular exercise and limiting intake of fatty foods  - Recheck lipid panel, consider starting ezetimibe if LDL elevated

## 2022-03-01 ENCOUNTER — Telehealth: Payer: Self-pay | Admitting: Student

## 2022-03-01 LAB — LIPID PANEL
Chol/HDL Ratio: 3.5 ratio (ref 0.0–4.4)
Cholesterol, Total: 203 mg/dL — ABNORMAL HIGH (ref 100–199)
HDL: 58 mg/dL
LDL Chol Calc (NIH): 134 mg/dL — ABNORMAL HIGH (ref 0–99)
Triglycerides: 58 mg/dL (ref 0–149)
VLDL Cholesterol Cal: 11 mg/dL (ref 5–40)

## 2022-03-02 ENCOUNTER — Other Ambulatory Visit: Payer: Self-pay

## 2022-03-02 ENCOUNTER — Encounter (HOSPITAL_COMMUNITY): Payer: Self-pay

## 2022-03-02 ENCOUNTER — Emergency Department (HOSPITAL_COMMUNITY): Payer: 59

## 2022-03-02 ENCOUNTER — Emergency Department (HOSPITAL_COMMUNITY)
Admission: EM | Admit: 2022-03-02 | Discharge: 2022-03-02 | Disposition: A | Discharge status: Home / Self Care | Payer: 59 | Attending: Emergency Medicine | Admitting: Emergency Medicine

## 2022-03-02 DIAGNOSIS — I10 Essential (primary) hypertension: Secondary | ICD-10-CM

## 2022-03-02 DIAGNOSIS — R519 Headache, unspecified: Secondary | ICD-10-CM | POA: Diagnosis not present

## 2022-03-02 LAB — CBC WITH DIFFERENTIAL/PLATELET
Abs Immature Granulocytes: 0.01 10*3/uL (ref 0.00–0.07)
Basophils Absolute: 0 10*3/uL (ref 0.0–0.1)
Basophils Relative: 0 %
Eosinophils Absolute: 0.1 10*3/uL (ref 0.0–0.5)
Eosinophils Relative: 3 %
HCT: 42.4 % (ref 36.0–46.0)
Hemoglobin: 13.9 g/dL (ref 12.0–15.0)
Immature Granulocytes: 0 %
Lymphocytes Relative: 35 %
Lymphs Abs: 1.8 10*3/uL (ref 0.7–4.0)
MCH: 28.1 pg (ref 26.0–34.0)
MCHC: 32.8 g/dL (ref 30.0–36.0)
MCV: 85.8 fL (ref 80.0–100.0)
Monocytes Absolute: 0.4 10*3/uL (ref 0.1–1.0)
Monocytes Relative: 8 %
Neutro Abs: 2.9 10*3/uL (ref 1.7–7.7)
Neutrophils Relative %: 54 %
Platelets: 191 10*3/uL (ref 150–400)
RBC: 4.94 MIL/uL (ref 3.87–5.11)
RDW: 14.3 % (ref 11.5–15.5)
WBC: 5.2 10*3/uL (ref 4.0–10.5)
nRBC: 0 % (ref 0.0–0.2)

## 2022-03-02 LAB — I-STAT BETA HCG BLOOD, ED (MC, WL, AP ONLY): I-stat hCG, quantitative: <5 m[IU]/mL (ref <5)

## 2022-03-02 LAB — BASIC METABOLIC PANEL
Anion gap: 11 (ref 5–15)
BUN: 9 mg/dL (ref 6–20)
CO2: 27 mmol/L (ref 22–32)
Calcium: 9.8 mg/dL (ref 8.9–10.3)
Chloride: 99 mmol/L (ref 98–111)
Creatinine, Ser: 0.59 mg/dL (ref 0.44–1.00)
GFR, Estimated: >60 mL/min (ref >60)
Glucose, Bld: 103 mg/dL — ABNORMAL HIGH (ref 70–99)
Potassium: 3.7 mmol/L (ref 3.5–5.1)
Sodium: 137 mmol/L (ref 135–145)

## 2022-03-02 MED ORDER — BUTALBITAL-APAP-CAFFEINE 50-325-40 MG PO TABS: 1.0000 | ORAL_TABLET | Freq: Four times a day (QID) | ORAL | 0 refills | Status: DC | PRN

## 2022-03-02 NOTE — ED Notes (Signed)
DC instructions reviewed with pt. PT verbalized understanding. PT DC °

## 2022-03-02 NOTE — ED Triage Notes (Signed)
Pt c/o HA since Monday; constant; pt seen at PCP on Monday, BP elevated, was told to monitor; pt endorses compliance with BP meds; HA 5/10; endorses fatigue; denies other neuro deficits

## 2022-03-02 NOTE — ED Provider Triage Note (Cosign Needed Addendum)
Emergency Medicine Provider Triage Evaluation Note  Yvonne Turner , a 57 y.o. female  was evaluated in triage.  Pt complains of new onset headache since Monday.  Does not get headaches often.  Distributed in bandlike fashion of forehead.  Denies painful vision or any vision changes.  Denies dizziness or lightheadedness.  No recent fevers or neck stiffness.  No recent injury.  Not sudden in onset.  Review of Systems  Positive:  Negative: See above  Physical Exam  BP (!) 161/104   Pulse 65   Temp 98.6 F (37 C) (Oral)   Resp 20   SpO2 98%  Gen:   Awake, no distress   Resp:  Normal effort  MSK:   Moves extremities without difficulty  Other:  Gaze aligned appropriately.  No significant temporal tenderness.  Sitting comfortably.  Medical Decision Making  Medically screening exam initiated at 1:35 PM.  Appropriate orders placed.  Yvonne Turner was informed that the remainder of the evaluation will be completed by another provider, this initial triage assessment does not replace that evaluation, and the importance of remaining in the ED until their evaluation is complete.     Prince Rome, PA-C 93/90/30 1339

## 2022-03-02 NOTE — ED Provider Notes (Signed)
[facility] Provider Note   CSN: [id number] Arrival date & time: 03/02/22  1054     History  No chief complaint on file.   Yvonne Turner is a 57 y.o. female.  57 year old female who presents with several days of bandlike headache which began at rest.  Patient states it feels like a squeeze between her temples.  No associated nausea or vomiting.  No photophobia.  No neck pain.  No ataxia.  No new focal weakness.  Does have history of high blood pressure and has been compliant with her medications.  Denies any sinus pain or pressure.  No visual changes.  Patient states her symptoms get better gradually throughout the day.  No treatment use prior to arrival.       Home Medications Prior to Admission medications   Medication Sig Start Date End Date Taking? Authorizing Provider  albuterol (VENTOLIN HFA) 108 (90 Base) MCG/ACT inhaler Inhale 2 puffs into the lungs every 6 (six) hours as needed for wheezing or shortness of breath. 03/22/21   Freddi Starr, MD  atorvastatin (LIPITOR) 80 MG tablet Take 1 tablet (80 mg total) by mouth daily. 09/29/21   Atway, Rayann N, DO  docusate sodium (COLACE) 100 MG capsule Take 1 capsule (100 mg total) by mouth 2 (two) times daily as needed for mild constipation or moderate constipation. 11/03/21   Anyanwu, Sallyanne Havers, MD  fluticasone furoate-vilanterol (BREO ELLIPTA) 100-25 MCG/ACT AEPB Inhale 1 puff into the lungs daily. 02/21/22   Freddi Starr, MD  hydrochlorothiazide (HYDRODIURIL) 25 MG tablet Take 1 tablet (25 mg total) by mouth daily. 09/28/21   Atway, Rayann N, DO  ibuprofen (ADVIL) 600 MG tablet Take 1 tablet (600 mg total) by mouth every 6 (six) hours as needed for headache, mild pain, moderate pain or cramping. 11/03/21   Anyanwu, Sallyanne Havers, MD  montelukast (SINGULAIR) 10 MG tablet Take 1 tablet (10 mg total) by mouth daily. 09/28/21 09/28/22  Dorethea Clan, DO  Multiple Vitamin (DAILY-VITE  MULTIVITAMIN) TABS Take 1 tablet by mouth daily. 07/02/20   Lyndal Pulley, MD  oxyCODONE (OXY IR/ROXICODONE) 5 MG immediate release tablet Take 1 tablet (5 mg total) by mouth every 6 (six) hours as needed for severe pain or breakthrough pain. 11/03/21   Anyanwu, Sallyanne Havers, MD  timolol (TIMOPTIC) 0.5 % ophthalmic solution 1 drop every morning. 12/03/21   [provider]      Allergies    Patient has no known allergies.    Review of Systems   Review of Systems  All other systems reviewed and are negative.   Physical Exam Updated Vital Signs BP (!) 157/99 (BP Location: Left Arm)   Pulse 75   Temp 98.1 F (36.7 C) (Oral)   Resp 18   SpO2 100%  Physical Exam Vitals and nursing note reviewed.  Constitutional:      General: She is not in acute distress.    Appearance: Normal appearance. She is well-developed. She is not toxic-appearing.  HENT:     Head: Normocephalic and atraumatic.  Eyes:     General: Lids are normal.     Conjunctiva/sclera: Conjunctivae normal.     Pupils: Pupils are equal, round, and reactive to light.  Neck:     Thyroid: No thyroid mass.     Trachea: No tracheal deviation.  Cardiovascular:     Rate and Rhythm: Normal rate and regular rhythm.     Heart sounds: Normal  heart sounds. No murmur heard.    No gallop.  Pulmonary:     Effort: Pulmonary effort is normal. No respiratory distress.     Breath sounds: Normal breath sounds. No stridor. No decreased breath sounds, wheezing, rhonchi or rales.  Abdominal:     General: There is no distension.     Palpations: Abdomen is soft.     Tenderness: There is no abdominal tenderness. There is no rebound.  Musculoskeletal:        General: No tenderness. Normal range of motion.     Cervical back: Normal range of motion and neck supple.  Skin:    General: Skin is warm and dry.     Findings: No abrasion or rash.  Neurological:     General: No focal deficit present.     Mental Status: She is alert and  oriented to person, place, and time. Mental status is at baseline.     GCS: GCS eye subscore is 4. GCS verbal subscore is 5. GCS motor subscore is 6.     Cranial Nerves: No cranial nerve deficit.     Sensory: No sensory deficit.     Motor: Motor function is intact.  Psychiatric:        Attention and Perception: Attention normal.        Speech: Speech normal.        Behavior: Behavior normal.     ED Results / Procedures / Treatments   Labs (all labs ordered are listed, but only abnormal results are displayed) Labs Reviewed  BASIC METABOLIC PANEL - Abnormal; Notable for the following components:      Result Value   Glucose, Bld 103 (*)    All other components within normal limits  CBC WITH DIFFERENTIAL/PLATELET  I-STAT BETA HCG BLOOD, ED ([facility], [facility], [facility] ONLY)    EKG None  Radiology CT HEAD WO CONTRAST  Result Date: 03/02/2022 CLINICAL DATA:  Provided history: Headache, new onset. EXAM: CT HEAD WITHOUT CONTRAST TECHNIQUE: Contiguous axial images were obtained from the base of the skull through the vertex without intravenous contrast. RADIATION DOSE REDUCTION: This exam was performed according to the departmental dose-optimization program which includes automated exposure control, adjustment of the mA and/or kV according to patient size and/or use of iterative reconstruction technique. COMPARISON:  No pertinent prior exams available for comparison. FINDINGS: Brain: No age advanced or lobar predominant parenchymal atrophy. Partially empty sella turcica. There is no acute intracranial hemorrhage. No demarcated cortical infarct. No extra-axial fluid collection. No evidence of an intracranial mass. No midline shift. Vascular: No hyperdense vessel. Atherosclerotic calcifications. Skull: No fracture or aggressive osseous lesion. Sinuses/Orbits: No mass or acute finding within the imaged orbits. No significant paranasal sinus disease at the imaged levels. Other: Small-volume fluid within the bilateral  mastoid air cells. IMPRESSION: No evidence of acute intracranial hemorrhage, acute infarct or intracranial mass. Partially empty sella turcica. This finding can reflect incidental anatomic variation, or alternatively, it can be associated with intracranial hypertension (pseudotumor cerebri). Small-volume fluid within the bilateral mastoid air cells. Electronically Signed   By: Kellie Simmering D.O.   On: 03/02/2022 14:57    Procedures Procedures    Medications Ordered in ED Medications - No data to display  ED Course/ Medical Decision Making/ A&P                             Medical Decision Making  Patient's head CT per interpretation showed no  acute findings.  Blood pressure noted here but does not require intervention at this time.  Suspect patient has a tension headache.  No concern for subarachnoid hemorrhage or CVA.  Patient to follow-up with her primary care physician.        Final Clinical Impression(s) / ED Diagnoses Final diagnoses:  None    Rx / DC Orders ED Discharge Orders     None         Lacretia Leigh, MD 03/02/22 1525

## 2022-03-03 NOTE — Progress Notes (Signed)
   CC: Emergency (BP) Follow-up  HPI:   [name] is a 57 y.o. female with a past medical history of asthma, hypertension, hyperlipidemia, and anemia who presents for ED follow-up.  She was last seen at [facility] on 1-22 for routine check-up. Since then, she visited the ED on 1-24 for headache and home BP reading of 220/150. Her BP had improved to 157/99 by the time she arrived, diagnostic workup for CVA was negative, and she was discharged with instructions to follow up with [facility].   Past Medical History:  Diagnosis Date   Anemia    Asthma    COVID 2023   mild case (early 2023)   GERD (gastroesophageal reflux disease)    Hyperlipidemia    Hypertension    Pneumonia    Umbilical hernia      Review of Systems:    Reports feeling good overall Denies fever, chills, lightheadedness, dizziness, blurry vision, headache, anxiety, chest pain, palpitations   Physical Exam:  Vitals:   03/07/22 0959 03/07/22 1029  BP: 136/76 128/77  Pulse: 79 72  Temp: 98 F (36.7 C)   TempSrc: Oral   SpO2: 100%   Weight: (!) 335 lb 3.2 oz (152 kg)   Height: '5\' 11"'$  (1.803 m)     General:   awake and alert, sitting comfortably in chair, cooperative and pleasant, not in acute distress Skin:   warm and dry, intact without any obvious lesions or scars, no rashes or lesions  Head:   normocephalic and atraumatic, oral mucosa moist  Lungs:   normal respiratory effort, breathing unlabored, symmetrical chest rise, no crackles or wheezing Cardiac:   regular rate and rhythm, normal S1 and S2 Neurologic:   oriented to person-place-time, moving all extremities, no gross focal deficits Psychiatric:   euthymic mood with congruent affect, intelligible speech    Assessment & Plan:   Hypertension Patient has history of hypertension managed at home with hydrochlorothiazide, to which she reports strong compliance including the past week. She has also been checking her BP at home since her recent [facility] visit with  values consistently in the 130-150s/80-90s range. Today in clinic, her BP was 136/76 with repeat measurement 128/77. Since her visit to the ED on 1-22, she denies experiencing any lightheadedness, dizziness, blurry vision, headache, anxiety, chest pain, and palpitations. Also denies any recent psychological illnesses or stressors. It remains unclear as to why her BP was acutely elevated last week, but reassuring that she has been completely asymptomatic. Given BP values well within the normal range in clinic today, new changes to her medication regimen are unnecessary.  - Continue hydrochlorothiazide '25mg'$  q24 - Continue checking blood pressure at home - Calibrate home cuff with clinic device at next visit    Hyperlipidemia Most recent lipid panel collected 02-2022 notable for LDL 134 above goal of <100. Patient volunteered that her eating habits had been sub-optimal over the holiday season and, instead of taking a new medication, prefers to address her high cholesterol with diet and lifestyle changes. She is agreeable to starting ezetimibe at her next visit if LDL remains elevated above our goal.  - Continue atorvastatin '80mg'$  q24 - Consider ezetimibe at next visit if LDL>100      See Encounters Tab for problem based charting.  Patient discussed with Dr. Jimmye Norman

## 2022-03-07 ENCOUNTER — Encounter: Payer: Self-pay | Admitting: Student

## 2022-03-07 ENCOUNTER — Ambulatory Visit (INDEPENDENT_AMBULATORY_CARE_PROVIDER_SITE_OTHER): Payer: 59 | Admitting: Student

## 2022-03-07 VITALS — BP 128/77 | HR 72 | Temp 98.0°F | Ht 71.0 in | Wt 335.2 lb

## 2022-03-07 DIAGNOSIS — Z87891 Personal history of nicotine dependence: Secondary | ICD-10-CM | POA: Diagnosis not present

## 2022-03-07 DIAGNOSIS — E785 Hyperlipidemia, unspecified: Secondary | ICD-10-CM | POA: Diagnosis not present

## 2022-03-07 DIAGNOSIS — I10 Essential (primary) hypertension: Secondary | ICD-10-CM

## 2022-03-07 NOTE — Patient Instructions (Signed)
  Thank you, [name], for allowing [place] to provide your care today. Today we discussed . . .  > Hypertension       - continue to take your hydrochlorothiazide daily       - please continue to check your blood pressure at home       - we recommend bringing your cuff to your next visit in 79mo      - keep up the great work with the juice mixes!   I have ordered the following labs for you:  Lab Orders  No laboratory test(s) ordered today      Tests ordered today:  none   Referrals ordered today:   Referral Orders  No referral(s) requested today      I have ordered the following medication/changed the following medications:   Stop the following medications: There are no discontinued medications.   Start the following medications: No orders of the defined types were placed in this encounter.     Follow up: 3 months    Remember:  Please continue to take your medications including hydrochlorothiazide. Check your blood pressure regularly at home and we will see you again in about three months!   Should you have any questions or concerns please call the internal medicine clinic at [phone]     DRoswell Nickel MD [place]

## 2022-03-07 NOTE — Progress Notes (Signed)
Internal Medicine Clinic Attending  Case discussed with the resident at the time of the visit.  We reviewed the resident's history and exam and pertinent patient test results.  I agree with the assessment, diagnosis, and plan of care documented in the resident's note.  

## 2022-03-07 NOTE — Telephone Encounter (Signed)
Dr Jacelyn Grip note from this encounter " Spoke with patient via phone this afternoon and was informed that her blood pressure is elevated to 220/150, also reporting a mild headache. Denies blurry vision, chest pain, and dizziness. Advised her to visit the emergency department for further evaluation. She agreed to this plan.  "

## 2022-03-07 NOTE — Assessment & Plan Note (Signed)
Most recent lipid panel collected 02-2022 notable for LDL 134 above goal of <100. Patient volunteered that her eating habits had been sub-optimal over the holiday season and, instead of taking a new medication, prefers to address her high cholesterol with diet and lifestyle changes. She is agreeable to starting ezetimibe at her next visit if LDL remains elevated above our goal.  - Continue atorvastatin '80mg'$  q24 - Consider ezetimibe at next visit if LDL>100

## 2022-03-07 NOTE — Assessment & Plan Note (Signed)
Patient has history of hypertension managed at home with hydrochlorothiazide, to which she reports strong compliance including the past week. She has also been checking her BP at home since her recent [facility] visit with values consistently in the 130-150s/80-90s range. Today in clinic, her BP was 136/76 with repeat measurement 128/77. Since her visit to the ED on 1-22, she denies experiencing any lightheadedness, dizziness, blurry vision, headache, anxiety, chest pain, and palpitations. Also denies any recent psychological illnesses or stressors. It remains unclear as to why her BP was acutely elevated last week, but reassuring that she has been completely asymptomatic. Given BP values well within the normal range in clinic today, new changes to her medication regimen are unnecessary.  - Continue hydrochlorothiazide '25mg'$  q24 - Continue checking blood pressure at home - Calibrate home cuff with clinic device at next visit

## 2022-03-09 ENCOUNTER — Ambulatory Visit (INDEPENDENT_AMBULATORY_CARE_PROVIDER_SITE_OTHER): Payer: 59 | Admitting: Family Medicine

## 2022-03-09 ENCOUNTER — Encounter: Payer: Self-pay | Admitting: Family Medicine

## 2022-03-09 VITALS — BP 123/85 | HR 77 | Ht 71.0 in | Wt 332.6 lb

## 2022-03-09 DIAGNOSIS — N950 Postmenopausal bleeding: Secondary | ICD-10-CM

## 2022-03-09 NOTE — Progress Notes (Signed)
   GYNECOLOGY PROBLEM  VISIT ENCOUNTER NOTE  Subjective:   Amany Rando is a 57 y.o. [phone] female here for a problem GYN visit.  Current complaints: none.   Wanted to let us know she is OK after her surgery and also she has had no bleeding.   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 From [place] and moved to [facility] from [place]  Last pap was 04/03/21  Denies abnormal vaginal bleeding, discharge, pelvic pain, problems with intercourse or other gynecologic concerns.    Gynecologic History No LMP recorded. Patient is postmenopausal.  Contraception: none  There are no preventive care reminders to display for this patient.  The following portions of the patient's history were reviewed and updated as appropriate: allergies, current medications, past family history, past medical history, past social history, past surgical history and problem list.  Review of Systems Pertinent items are noted in HPI.   Objective:  BP 123/85   Pulse 77   Ht '5\' 11"'$  (1.803 m)   Wt (!) 332 lb 9.6 oz (150.9 kg)   BMI 46.39 kg/m  Gen: well appearing, NAD HEENT: no scleral icterus CV: RR Lung: Normal WOB Ext: warm well perfused   Assessment and Plan:   1. Postmenopausal bleeding Resolved after D&C No concerns  Follow up PRN  Please refer to After Visit Summary for other counseling recommendations.   Return if symptoms worsen or fail to improve.  Caren Macadam, MD, MPH, ABFM Attending [place] for [facility]

## 2022-03-10 NOTE — Progress Notes (Signed)
Internal Medicine Clinic Attending  Case discussed with Dr. Jodi Mourning  At the time of the visit.  We reviewed the resident's history and exam and pertinent patient test results.  I agree with the assessment, diagnosis, and plan of care documented in the resident's note.

## 2022-03-22 ENCOUNTER — Encounter: Payer: Self-pay | Admitting: Pulmonary Disease

## 2022-03-22 ENCOUNTER — Ambulatory Visit: Payer: 59 | Admitting: Pulmonary Disease

## 2022-03-22 DIAGNOSIS — J4540 Moderate persistent asthma, uncomplicated: Secondary | ICD-10-CM | POA: Diagnosis not present

## 2022-03-22 MED ORDER — FLUTICASONE FUROATE-VILANTEROL 100-25 MCG/ACT IN AEPB: 1.0000 | INHALATION_SPRAY | Freq: Every day | RESPIRATORY_TRACT | 5 refills | Status: DC

## 2022-03-22 MED ORDER — ALBUTEROL SULFATE HFA 108 (90 BASE) MCG/ACT IN AERS: 2.0000 | INHALATION_SPRAY | Freq: Four times a day (QID) | RESPIRATORY_TRACT | 5 refills | Status: DC | PRN

## 2022-03-22 NOTE — Patient Instructions (Addendum)
Continue breo ellipta 1 puff daily - rinse mouth out after each use  Continue albuterol 1-2 puffs every 4-6 hours  Continue montelukast 62m daily  Follow up in 1 year, call sooner if needed

## 2022-03-22 NOTE — Progress Notes (Signed)
Synopsis: Referred in August 2022 for asthma by Candace Cruise, MD  Subjective:   PATIENT ID: Yvonne Turner GENDER: female DOB: March 19, 1965, MRN: [id number]  HPI  Chief Complaint  Patient presents with   Follow-up    49yrf/[id number] for asthma. States she is still using her Breo daily. Denies any new concerns.    Yvonne Cheathamis a 57year old woman, former smoker with history of hypertension who returns to pulmonary clinic for asthma.   She has been on breo ellipta 100-2642m 1 puff daily. She has not needed her rescue albuterol inhaler. [facility] the breo.   No other complaints at this time.   OV 03/22/21 She continues on dulera 200-42mc53m puffs twice daily. She is using albuterol about 1-2 times per week for shortness of breath. She denies any complaints of wheezing, cough or shortness of breath. She does not have night time awakenings.   OV 09/16/20 She has history of asthma since 2008 and has been on maintenance inhalers since.  She is currently on Dulera 200-5 MCG 2 puffs twice daily and Singulair 10 mg daily.  She has an albuterol rescue inhaler which she does not require [facility] [place] [facility] [place] care visits last June due to the allergies that aggravated her asthma symptoms.  She required prednisone and nebulizer treatments at that time.  [facility]ast at that time she has not required further prednisone tapers.  She denies any nighttime awakenings.  She denies any sinus congestion or drainage.  She does have intermittent GERD symptoms which she takes omeprazole as needed about every other day.  [place].  She denies any history of secondhand smoke exposure. Works at [employer].  Past Medical History:  Diagnosis Date   Anemia    Asthma    COVID 2023   mild case  (early 2023)   GERD (gastroesophageal reflux disease)    Hyperlipidemia    Hypertension    Pneumonia    Umbilical hernia      Family History  Problem Relation Age of Onset   Pancreatic cancer Mother    Diabetes type II Father      Social History   Socioeconomic History   Marital status: Married    Spouse name: Not on file   Number of children: Not on file   Years of education: Not on file   Highest education level: Not on file  Occupational History   Occupation: [employer]    Comment: [employer]  Tobacco Use   Smoking status: Former    Packs/day: 0.50    Years: 20.00    Total pack years: 10.00    Types: Cigarettes    Start date: 197427Quit date: 1994    Years since quitting: 30.1   Smokeless tobacco: Never  Vaping Use   Vaping Use: Never used  Substance and Sexual Activity   Alcohol use: Never   Drug use: Never   Sexual activity: Yes    Partners: Male  Other Topics Concern   Not on file  Social History Narrative   Not on file   Social Determinants of Health   Financial Resource Strain: Not on file  Food Insecurity: No Food Insecurity (03/09/2022)   Hunger Vital  Sign    Worried About Charity fundraiser in the Last Year: Never true    Queets in the Last Year: Never true  Transportation Needs: No Transportation Needs (03/09/2022)   PRAPARE - Hydrologist (Medical): No    Lack of Transportation (Non-Medical): No  Physical Activity: Not on file  Stress: Not on file  Social Connections: Not on file  Intimate Partner Violence: Not on file     No Known Allergies   Outpatient Medications Prior to Visit  Medication Sig Dispense Refill   atorvastatin (LIPITOR) 80 MG tablet Take 1 tablet (80 mg total) by mouth daily. 90 tablet 3   hydrochlorothiazide (HYDRODIURIL) 25 MG tablet Take 1 tablet (25 mg total) by mouth daily. 90 tablet 3   montelukast (SINGULAIR) 10 MG tablet Take 1 tablet (10 mg total) by mouth daily.  90 tablet 3   Multiple Vitamin (DAILY-VITE MULTIVITAMIN) TABS Take 1 tablet by mouth daily. 90 tablet 1   timolol (TIMOPTIC) 0.5 % ophthalmic solution 1 drop every morning.     albuterol (VENTOLIN HFA) 108 (90 Base) MCG/ACT inhaler Inhale 2 puffs into the lungs every 6 (six) hours as needed for wheezing or shortness of breath. 8 g 2   fluticasone furoate-vilanterol (BREO ELLIPTA) 100-25 MCG/ACT AEPB Inhale 1 puff into the lungs daily. 60 each 2   No facility-administered medications prior to visit.   Review of Systems  Constitutional:  Negative for chills, fever, malaise/fatigue and weight loss.  HENT:  Negative for congestion, sinus pain and sore throat.   Eyes: Negative.   Respiratory:  Negative for cough, hemoptysis, sputum production, shortness of breath and wheezing.   Cardiovascular:  Negative for chest pain, palpitations, orthopnea, claudication and leg swelling.  Gastrointestinal:  Negative for abdominal pain, heartburn, nausea and vomiting.  Genitourinary: Negative.   Musculoskeletal:  Negative for joint pain and myalgias.  Skin:  Negative for rash.  Neurological:  Negative for weakness.  Endo/Heme/Allergies: Negative.   Psychiatric/Behavioral: Negative.     Objective:   Vitals:   03/22/22 0827  BP: 122/76  Pulse: 88  SpO2: 100%  Weight: (!) 329 lb (149.2 kg)  Height: 5' 11"$  (1.803 m)    Physical Exam Constitutional:      General: She is not in acute distress.    Appearance: She is obese. She is not ill-appearing.  HENT:     Head: Normocephalic and atraumatic.  Eyes:     General: No scleral icterus.    Conjunctiva/sclera: Conjunctivae normal.  Cardiovascular:     Rate and Rhythm: Normal rate and regular rhythm.     Pulses: Normal pulses.     Heart sounds: Normal heart sounds. No murmur heard. Pulmonary:     Effort: Pulmonary effort is normal.     Breath sounds: Normal breath sounds. No wheezing, rhonchi or rales.  Musculoskeletal:     Right lower leg: No  edema.     Left lower leg: No edema.  Skin:    General: Skin is warm and dry.  Neurological:     General: No focal deficit present.     Mental Status: She is alert.     CBC    Component Value Date/Time   WBC 5.2 03/02/2022 1342   RBC 4.94 03/02/2022 1342   HGB 13.9 03/02/2022 1342   HCT 42.4 03/02/2022 1342   PLT 191 03/02/2022 1342   MCV 85.8 03/02/2022 1342   MCH 28.1 03/02/2022 1342  MCHC 32.8 03/02/2022 1342   RDW 14.3 03/02/2022 1342   LYMPHSABS 1.8 03/02/2022 1342   MONOABS 0.4 03/02/2022 1342   EOSABS 0.1 03/02/2022 1342   BASOSABS 0.0 03/02/2022 1342      Latest Ref Rng & Units 03/02/2022    1:42 PM 11/03/2021    9:00 AM 05/28/2021   10:25 AM  BMP  Glucose 70 - 99 mg/dL 103  96  90   BUN 6 - 20 mg/dL 9  13  6   $ Creatinine 0.44 - 1.00 mg/dL 0.59  0.66  0.58   BUN/Creat Ratio 9 - 23   10   Sodium 135 - 145 mmol/L 137  140  145   Potassium 3.5 - 5.1 mmol/L 3.7  3.4  4.2   Chloride 98 - 111 mmol/L 99  102  105   CO2 22 - 32 mmol/L 27  31  26   $ Calcium 8.9 - 10.3 mg/dL 9.8  9.5  9.1    Chest imaging: CXR 04/02/20 Heart size is normal. Lung volumes are somewhat low. No edema or effusion is present. No focal airspace disease is present.  PFT:     No data to display            Assessment & Plan:   Moderate persistent asthma without complication - Plan: fluticasone furoate-vilanterol (BREO ELLIPTA) 100-25 MCG/ACT AEPB, albuterol (VENTOLIN HFA) 108 (90 Base) MCG/ACT inhaler  Discussion: Yvonne Turner is a 57 year old woman, former smoker with history of hypertension who returns to pulmonary clinic for asthma.   She is doing well on Breo ellipta 100-70mg 1 puff daily and montelukast 10 mg daily and is to continue this regimen.   Follow-up in 1 year.  JFreda Jackson MD [facility]: [phone]  Current Outpatient Medications:    atorvastatin (LIPITOR) 80 MG tablet, Take 1 tablet (80 mg total) by mouth daily., Disp: 90  tablet, Rfl: 3   hydrochlorothiazide (HYDRODIURIL) 25 MG tablet, Take 1 tablet (25 mg total) by mouth daily., Disp: 90 tablet, Rfl: 3   montelukast (SINGULAIR) 10 MG tablet, Take 1 tablet (10 mg total) by mouth daily., Disp: 90 tablet, Rfl: 3   Multiple Vitamin (DAILY-VITE MULTIVITAMIN) TABS, Take 1 tablet by mouth daily., Disp: 90 tablet, Rfl: 1   timolol (TIMOPTIC) 0.5 % ophthalmic solution, 1 drop every morning., Disp: , Rfl:    albuterol (VENTOLIN HFA) 108 (90 Base) MCG/ACT inhaler, Inhale 2 puffs into the lungs every 6 (six) hours as needed for wheezing or shortness of breath., Disp: 8 g, Rfl: 5   fluticasone furoate-vilanterol (BREO ELLIPTA) 100-25 MCG/ACT AEPB, Inhale 1 puff into the lungs daily., Disp: 60 each, Rfl: 5

## 2022-05-16 ENCOUNTER — Other Ambulatory Visit: Payer: Self-pay | Admitting: Pulmonary Disease

## 2022-05-16 DIAGNOSIS — J4540 Moderate persistent asthma, uncomplicated: Secondary | ICD-10-CM

## 2022-05-30 ENCOUNTER — Encounter: Payer: Self-pay | Admitting: Student

## 2022-05-30 ENCOUNTER — Encounter: Payer: 59 | Admitting: Student

## 2022-07-12 ENCOUNTER — Encounter: Payer: Self-pay | Admitting: *Deleted

## 2022-08-08 ENCOUNTER — Other Ambulatory Visit: Payer: Self-pay

## 2022-08-08 ENCOUNTER — Ambulatory Visit: Payer: 59 | Admitting: Student

## 2022-08-08 ENCOUNTER — Encounter: Payer: Self-pay | Admitting: Student

## 2022-08-08 VITALS — BP 131/78 | HR 88 | Temp 98.3°F | Ht 71.0 in | Wt 327.7 lb

## 2022-08-08 DIAGNOSIS — J45909 Unspecified asthma, uncomplicated: Secondary | ICD-10-CM

## 2022-08-08 DIAGNOSIS — I10 Essential (primary) hypertension: Secondary | ICD-10-CM

## 2022-08-08 DIAGNOSIS — J4540 Moderate persistent asthma, uncomplicated: Secondary | ICD-10-CM

## 2022-08-08 DIAGNOSIS — E785 Hyperlipidemia, unspecified: Secondary | ICD-10-CM | POA: Diagnosis not present

## 2022-08-08 DIAGNOSIS — Z87891 Personal history of nicotine dependence: Secondary | ICD-10-CM

## 2022-08-08 DIAGNOSIS — N644 Mastodynia: Secondary | ICD-10-CM | POA: Diagnosis not present

## 2022-08-08 MED ORDER — HYDROCHLOROTHIAZIDE 25 MG PO TABS: 25.0000 mg | ORAL_TABLET | Freq: Every day | ORAL | 3 refills | Status: DC

## 2022-08-08 MED ORDER — MONTELUKAST SODIUM 10 MG PO TABS: 10.0000 mg | ORAL_TABLET | Freq: Every day | ORAL | 3 refills | Status: AC

## 2022-08-08 MED ORDER — FLUTICASONE FUROATE-VILANTEROL 100-25 MCG/ACT IN AEPB: 1.0000 | INHALATION_SPRAY | Freq: Every day | RESPIRATORY_TRACT | 8 refills | Status: DC

## 2022-08-08 MED ORDER — ATORVASTATIN CALCIUM 80 MG PO TABS: 80.0000 mg | ORAL_TABLET | Freq: Every day | ORAL | 3 refills | Status: DC

## 2022-08-08 NOTE — Assessment & Plan Note (Signed)
No exacerbations of late, symptoms well controlled on Breo. Will refill.

## 2022-08-08 NOTE — Assessment & Plan Note (Signed)
Blood pressure well-controlled at 131/78 today. Refill hydrochlorothiazide.

## 2022-08-08 NOTE — Assessment & Plan Note (Signed)
History and physical reassuring, likely a carbuncle under the left breast that has drained, seems to be healed over but still irritated by sweat. Skin looks clean and dry today, she's using cotton padding to keep inframammary skin clean and free of irritation. No palpable fluid collection or mass under the skin. No systemic illness. First occurrence and no other lesions in axillae or groin to suggest hidradenitis suppurativa. Because pain persists and this person is worried about breast cancer despite reassurance will order ultrasound of left breast, will be helpful to assess for deeper fluid collection that could be missed on exam although I doubt this is present. Continue supportive care with Tylenol, sparing use of ibuprofen for pain, and keeping skin clean and dry.

## 2022-08-08 NOTE — Patient Instructions (Signed)
This after visit summary is an important review of tests, referrals, and medication changes that were discussed during your visit. If you have questions or concerns, call [phone]. Outside of clinic business hours, call the main hospital at [phone] and ask the operator for the on-call internal medicine resident.   Ernesta Amble MD 08/08/2022, 4:45 PM

## 2022-08-08 NOTE — Progress Notes (Signed)
Acute Office Visit  Subjective:     Patient ID: Yvonne Turner, female    DOB: [birth year], 57 y.o.   MRN: [id number]  Chief Complaint  Patient presents with   Follow-up    LEFT BREAT PAIN # 4 X 2 WEEKS.    HPI Patient is in today for left breast pain ongoing 2 weeks. Started with sharper pain, she noticed a small boil in left inframammary fold that spontaneously drained pus with improvement, but not resolution, of pain afterwards. Although pain is less severe now she continues to have 4/10 pain that is exacerbated by sweating.  Review of Systems  Constitutional:  Negative for chills and fever.       No swelling or lumps under the arms  Breast: no nipple discharge      Objective:    BP 131/78 (BP Location: Left Arm, Patient Position: Sitting, Cuff Size: Large)   Pulse 88   Temp 98.3 F (36.8 C) (Oral)   Ht 5\' 11"  (1.803 m)   Wt (!) 327 lb 11.2 oz (148.6 kg)   SpO2 100%   BMI 45.70 kg/m    Physical Exam Constitutional:      General: She is not in acute distress.    Appearance: Normal appearance.  Cardiovascular:     Rate and Rhythm: Normal rate and regular rhythm.     Pulses: Normal pulses.  Pulmonary:     Effort: Pulmonary effort is normal.     Breath sounds: No stridor.  Chest:  Breasts:    Left: Skin change (Small hypertrophic area of scar left inframammary fold) present. No swelling, bleeding, inverted nipple, mass, nipple discharge or tenderness.  Lymphadenopathy:     Upper Body:     Left upper body: No axillary or pectoral adenopathy.  Skin:    General: Skin is warm and dry.  Neurological:     Mental Status: She is alert. Mental status is at baseline.     Gait: Gait normal.  Psychiatric:        Mood and Affect: Mood normal.        Behavior: Behavior normal.     No results found for any visits on 08/08/22.      Assessment & Plan:   Problem List Items Addressed This Visit     Asthma    No exacerbations of late, symptoms well controlled on  Breo. Will refill.      Relevant Medications   fluticasone furoate-vilanterol (BREO ELLIPTA) 100-25 MCG/ACT AEPB   montelukast (SINGULAIR) 10 MG tablet   Breast pain - Primary    History and physical reassuring, likely a carbuncle under the left breast that has drained, seems to be healed over but still irritated by sweat. Skin looks clean and dry today, she's using cotton padding to keep inframammary skin clean and free of irritation. No palpable fluid collection or mass under the skin. No systemic illness. First occurrence and no other lesions in axillae or groin to suggest hidradenitis suppurativa. Because pain persists and this person is worried about breast cancer despite reassurance will order ultrasound of left breast, will be helpful to assess for deeper fluid collection that could be missed on exam although I doubt this is present. Continue supportive care with Tylenol, sparing use of ibuprofen for pain, and keeping skin clean and dry.      Relevant Orders   [place] LIMITED ULTRASOUND INCLUDING AXILLA LEFT BREAST    Hyperlipidemia    Stable dose of atorvastatin 80 mg, needs  a refill today.      Relevant Medications   atorvastatin (LIPITOR) 80 MG tablet   hydrochlorothiazide (HYDRODIURIL) 25 MG tablet   Hypertension    Blood pressure well-controlled at 131/78 today. Refill hydrochlorothiazide.      Relevant Medications   atorvastatin (LIPITOR) 80 MG tablet   hydrochlorothiazide (HYDRODIURIL) 25 MG tablet    Meds ordered this encounter  Medications   fluticasone furoate-vilanterol (BREO ELLIPTA) 100-25 MCG/ACT AEPB    Sig: Inhale 1 puff into the lungs daily.    Dispense:  60 each    Refill:  8   atorvastatin (LIPITOR) 80 MG tablet    Sig: Take 1 tablet (80 mg total) by mouth daily.    Dispense:  90 tablet    Refill:  3   hydrochlorothiazide (HYDRODIURIL) 25 MG tablet    Sig: Take 1 tablet (25 mg total) by mouth daily.    Dispense:  90 tablet    Refill:  3   montelukast  (SINGULAIR) 10 MG tablet    Sig: Take 1 tablet (10 mg total) by mouth daily.    Dispense:  90 tablet    Refill:  3    Return if symptoms worsen or fail to improve.  Marrianne Mood, MD

## 2022-08-08 NOTE — Assessment & Plan Note (Signed)
Stable dose of atorvastatin 80 mg, needs a refill today.

## 2022-08-09 NOTE — Progress Notes (Signed)
Internal Medicine Clinic Attending  Case discussed with Dr. McLendon  At the time of the visit.  We reviewed the resident's history and exam and pertinent patient test results.  I agree with the assessment, diagnosis, and plan of care documented in the resident's note.  

## 2022-09-06 ENCOUNTER — Other Ambulatory Visit: Payer: Self-pay | Admitting: Internal Medicine

## 2022-09-06 DIAGNOSIS — N644 Mastodynia: Secondary | ICD-10-CM

## 2022-09-07 ENCOUNTER — Ambulatory Visit (HOSPITAL_COMMUNITY): Payer: 59

## 2022-09-19 ENCOUNTER — Other Ambulatory Visit: Payer: 59

## 2022-09-19 ENCOUNTER — Ambulatory Visit
Admission: RE | Admit: 2022-09-19 | Discharge: 2022-09-19 | Disposition: A | Discharge status: Home / Self Care | Payer: 59 | Source: Ambulatory Visit | Attending: Internal Medicine | Admitting: Internal Medicine

## 2022-09-19 DIAGNOSIS — N644 Mastodynia: Secondary | ICD-10-CM

## 2022-10-19 ENCOUNTER — Ambulatory Visit: Payer: 59 | Admitting: Student

## 2022-10-19 ENCOUNTER — Encounter: Payer: Self-pay | Admitting: Student

## 2022-10-19 VITALS — BP 133/86 | HR 81 | Wt 328.6 lb

## 2022-10-19 DIAGNOSIS — L723 Sebaceous cyst: Secondary | ICD-10-CM | POA: Diagnosis not present

## 2022-10-19 DIAGNOSIS — I10 Essential (primary) hypertension: Secondary | ICD-10-CM

## 2022-10-19 DIAGNOSIS — E785 Hyperlipidemia, unspecified: Secondary | ICD-10-CM | POA: Diagnosis not present

## 2022-10-19 DIAGNOSIS — Z23 Encounter for immunization: Secondary | ICD-10-CM | POA: Insufficient documentation

## 2022-10-19 DIAGNOSIS — R7303 Prediabetes: Secondary | ICD-10-CM | POA: Diagnosis not present

## 2022-10-19 DIAGNOSIS — Z0000 Encounter for general adult medical examination without abnormal findings: Secondary | ICD-10-CM | POA: Insufficient documentation

## 2022-10-19 NOTE — Assessment & Plan Note (Signed)
Patient last received her A1c on 05/28/2021 in which it was elevated at 5.7.  Because she is in the prediabetes phase, we feel it is appropriate to recheck this value as it has been over 1 year.  If A1c is greater than 7.0, we will consider adding metformin to her medication regimen. - Follow-up A1c

## 2022-10-19 NOTE — Progress Notes (Signed)
CC: Routine check-up  HPI: Yvonne Turner is a 57 y.o. female living with a history stated below and presents today for routine check-up. Please see problem based assessment and plan for additional details.  Past Medical History:  Diagnosis Date   Anemia    Asthma    COVID 2023   mild case (early 2023)   GERD (gastroesophageal reflux disease)    Hyperlipidemia    Hypertension    Pneumonia    Right hip pain 08/06/2016   Umbilical hernia     Current Outpatient Medications on File Prior to Visit  Medication Sig Dispense Refill   albuterol (VENTOLIN HFA) 108 (90 Base) MCG/ACT inhaler Inhale 2 puffs into the lungs every 6 (six) hours as needed for wheezing or shortness of breath. 8 g 5   atorvastatin (LIPITOR) 80 MG tablet Take 1 tablet (80 mg total) by mouth daily. 90 tablet 3   fluticasone furoate-vilanterol (BREO ELLIPTA) 100-25 MCG/ACT AEPB Inhale 1 puff into the lungs daily. 60 each 8   hydrochlorothiazide (HYDRODIURIL) 25 MG tablet Take 1 tablet (25 mg total) by mouth daily. 90 tablet 3   montelukast (SINGULAIR) 10 MG tablet Take 1 tablet (10 mg total) by mouth daily. 90 tablet 3   Multiple Vitamin (DAILY-VITE MULTIVITAMIN) TABS Take 1 tablet by mouth daily. 90 tablet 1   timolol (TIMOPTIC) 0.5 % ophthalmic solution 1 drop every morning.     No current facility-administered medications on file prior to visit.    Family History  Problem Relation Age of Onset   Pancreatic cancer Mother    Diabetes type II Father     Social History   Socioeconomic History   Marital status: Married    Spouse name: Not on file   Number of children: Not on file   Years of education: Not on file   Highest education level: Not on file  Occupational History   Occupation: [employer]    Comment: [employer]  Tobacco Use   Smoking status: Former    Current packs/day: 0.00    Average packs/day: 0.5 packs/day for 20.0 years (10.0 ttl pk-yrs)    Types: Cigarettes    Start date: 20     Quit date: 73    Years since quitting: 30.7   Smokeless tobacco: Never  Vaping Use   Vaping status: Never Used  Substance and Sexual Activity   Alcohol use: Never   Drug use: Never   Sexual activity: Yes    Partners: Male  Other Topics Concern   Not on file  Social History Narrative   Not on file   Social Determinants of Health   Financial Resource Strain: Not on file  Food Insecurity: No Food Insecurity (03/09/2022)   Hunger Vital Sign    Worried About Running Out of Food in the Last Year: Never true    Ran Out of Food in the Last Year: Never true  Transportation Needs: No Transportation Needs (03/09/2022)   PRAPARE - Administrator, Civil Service (Medical): No    Lack of Transportation (Non-Medical): No  Physical Activity: Not on file  Stress: Not on file  Social Connections: Not on file  Intimate Partner Violence: Not on file    Review of Systems: ROS negative except for what is noted on the assessment and plan.  Vitals:   10/19/22 1005  BP: 133/86  Pulse: 81  SpO2: 100%  Weight: (!) 328 lb 9.6 oz (149.1 kg)    Physical  Exam: Constitutional: well-appearing in no acute distress HENT: normocephalic atraumatic, mucous membranes moist Eyes: conjunctiva non-erythematous Neck: supple Cardiovascular: regular rate and rhythm, no m/r/g Pulmonary/Chest: normal work of breathing on room air, lungs clear to auscultation bilaterally Abdominal: soft, non-tender, non-distended MSK: normal bulk and tone Neurological: alert & oriented x 3, 5/5 strength in bilateral upper and lower extremities, normal gait Skin: warm and dry  Assessment & Plan:   Prediabetes Patient last received her A1c on 05/28/2021 in which it was elevated at 5.7.  Because she is in the prediabetes phase, we feel it is appropriate to recheck this value as it has been over 1 year.  If A1c is greater than 7.0, we will consider adding metformin to her medication regimen. - Follow-up  A1c  Hyperlipidemia Lipid panel on 02/2022 demonstrated LDL of 134, which is above the goal of 100.  At that time, she preferred to address her high cholesterol with diet and lifestyle changes.  Patient notes that it has been difficult to maintain healthy lifestyle this time.  Patient noted to have had good adherence to her medication.  We will follow-up with a lipid profile to reevaluate her LDL to see if she is at her goal of under 100. - Continue atorvastatin 80 mg every 24 hours - F/U lipid profile  Hypertension Managed at home with hydrochlorothiazide. Noted to have strong compliance in the past. Last measured last month and was ~130/80. No chest pain, SOB.  Today, BP in clinic is 133/86.  - Continue 25 mg hydrochlorothiazide daily   Sebaceous cyst Received [place] on 09/19/22. [place] interpretation noted the palpable site of concern in the left breast at 6 o'clock there is a likely benign dermal lesion favored to represent a sebaceous cyst. No clinical signs of active infection. Patient denies any current pain but notes discomfort during perspiration. Per radiology, dermatology consultation of left breast skin lesion. Patient prefers to have dermatology consult for possible removal.  - F/U dermatology   Health care maintenance Received flu shot today.   Patient seen with Dr. Georgena Spurling, MD  [facility] Internal Medicine, PGY-1 Date 10/19/2022 Time 11:12 AM

## 2022-10-19 NOTE — Assessment & Plan Note (Addendum)
Lipid panel on 02/2022 demonstrated LDL of 134, which is above the goal of 100.  At that time, she preferred to address her high cholesterol with diet and lifestyle changes.  Patient notes that it has been difficult to maintain healthy lifestyle this time.  Patient noted to have had good adherence to her medication.  We will follow-up with a lipid profile to reevaluate her LDL to see if she is at her goal of under 100. - Continue atorvastatin 80 mg every 24 hours - F/U lipid profile

## 2022-10-19 NOTE — Assessment & Plan Note (Signed)
Managed at home with hydrochlorothiazide. Noted to have strong compliance in the past. Last measured last month and was ~130/80. No chest pain, SOB.  Today, BP in clinic is 133/86.  - Continue 25 mg hydrochlorothiazide daily

## 2022-10-19 NOTE — Assessment & Plan Note (Addendum)
Received [place] on 09/19/22. [place] interpretation noted the palpable site of concern in the left breast at 6 o'clock there is a likely benign dermal lesion favored to represent a sebaceous cyst. No clinical signs of active infection. Patient denies any current pain but notes discomfort during perspiration. Per radiology, dermatology consultation of left breast skin lesion. Patient prefers to have dermatology consult for possible removal.  - F/U dermatology

## 2022-10-19 NOTE — Assessment & Plan Note (Signed)
Received flu shot today. 

## 2022-10-19 NOTE — Patient Instructions (Signed)
Thank you so much for coming to the clinic today!   I will follow up with your labs in the coming days, and I have also put in a referral to dermatology for the cyst.   If you have any questions please feel free to the call the clinic at anytime at [phone]. It was a pleasure seeing you!  Best, Dr. Rayvon Char

## 2022-10-20 NOTE — Progress Notes (Signed)
Internal Medicine Clinic Attending  I was physically present during the key portions of the resident provided service and participated in the medical decision making of patient's management care. I reviewed pertinent patient test results.  The assessment, diagnosis, and plan were formulated together and I agree with the documentation in the resident's note.  Erlinda Hong, MD FACP

## 2022-10-21 LAB — LIPID PANEL
Chol/HDL Ratio: 3.1 ratio (ref 0.0–4.4)
Cholesterol, Total: 192 mg/dL (ref 100–199)
HDL: 62 mg/dL (ref >39)
LDL Chol Calc (NIH): 118 mg/dL — ABNORMAL HIGH (ref 0–99)
Triglycerides: 64 mg/dL (ref 0–149)
VLDL Cholesterol Cal: 12 mg/dL (ref 5–40)

## 2022-10-21 LAB — HEMOGLOBIN A1C
Est. average glucose Bld gHb Est-mCnc: 117 mg/dL
Hgb A1c MFr Bld: 5.7 % — ABNORMAL HIGH (ref 4.8–5.6)

## 2022-10-21 NOTE — Progress Notes (Signed)
Attempt #1 to call patient regarding her elevated LDL and stable A1c but did not receive an answer. Would like to encourage her to continue taking her atorvastatin.

## 2022-10-25 NOTE — Progress Notes (Signed)
Called patient to discuss elevated LDL. Encouraged her to continue taking atorvastatin every day and that we will continue to follow her lipid panels.

## 2022-11-14 ENCOUNTER — Encounter: Payer: Self-pay | Admitting: Student

## 2022-11-14 ENCOUNTER — Other Ambulatory Visit: Payer: Self-pay | Admitting: Internal Medicine

## 2022-11-14 DIAGNOSIS — Z1231 Encounter for screening mammogram for malignant neoplasm of breast: Secondary | ICD-10-CM

## 2022-12-09 ENCOUNTER — Ambulatory Visit: Payer: 59

## 2023-01-02 ENCOUNTER — Ambulatory Visit
Admission: RE | Admit: 2023-01-02 | Discharge: 2023-01-02 | Disposition: A | Discharge status: Home / Self Care | Payer: 59 | Source: Ambulatory Visit | Attending: Internal Medicine | Admitting: Internal Medicine

## 2023-01-02 DIAGNOSIS — Z1231 Encounter for screening mammogram for malignant neoplasm of breast: Secondary | ICD-10-CM

## 2023-03-08 ENCOUNTER — Encounter: Payer: Self-pay | Admitting: Internal Medicine

## 2023-03-08 ENCOUNTER — Ambulatory Visit: Payer: 59 | Admitting: Internal Medicine

## 2023-03-08 VITALS — BP 126/72 | HR 85 | Temp 98.0°F | Ht 71.0 in | Wt 326.7 lb

## 2023-03-08 DIAGNOSIS — K644 Residual hemorrhoidal skin tags: Secondary | ICD-10-CM | POA: Diagnosis not present

## 2023-03-08 DIAGNOSIS — E785 Hyperlipidemia, unspecified: Secondary | ICD-10-CM | POA: Diagnosis not present

## 2023-03-08 DIAGNOSIS — Z23 Encounter for immunization: Secondary | ICD-10-CM | POA: Diagnosis not present

## 2023-03-08 DIAGNOSIS — I10 Essential (primary) hypertension: Secondary | ICD-10-CM | POA: Diagnosis not present

## 2023-03-08 DIAGNOSIS — Z0000 Encounter for general adult medical examination without abnormal findings: Secondary | ICD-10-CM

## 2023-03-08 MED ORDER — HYDROCORTISONE 1 % EX CREA
TOPICAL_CREAM | CUTANEOUS | 0 refills | Status: AC
Start: 2023-03-08 — End: 2023-03-15

## 2023-03-08 NOTE — Progress Notes (Unsigned)
Subjective:  CC: Annual checkup  HPI:  [name] is a 58 y.o. female with a past medical history of HLD, HTN, and asthma who presents today for annual checkup.   Please see problem based assessment and plan for additional details.  Past Medical History:  Diagnosis Date   Anemia    Asthma    GERD (gastroesophageal reflux disease)    Hyperlipidemia    Hypertension    Right hip pain 08/06/2016   Umbilical hernia     MEDICATIONS:  Hydrochlorothiazide 25mg  Atorvastatin 80 mg Singulair 10 mg Breo 100-25 mg Timolol 0.5%  Family History  Problem Relation Age of Onset   Pancreatic cancer Mother    Diabetes type II Father    BRCA 1/2 Neg Hx    Breast cancer Neg Hx     Social History   Socioeconomic History   Marital status: Married    Spouse name: Not on file   Number of children: Not on file   Years of education: Not on file   Highest education level: Not on file  Occupational History   Occupation: [employer]    Comment: [employer]  Tobacco Use   Smoking status: Former    Current packs/day: 0.00    Average packs/day: 0.5 packs/day for 20.0 years (10.0 ttl pk-yrs)    Types: Cigarettes    Start date: 74    Quit date: 59    Years since quitting: 31.1   Smokeless tobacco: Never  Vaping Use   Vaping status: Never Used  Substance and Sexual Activity   Alcohol use: Never   Drug use: Never   Sexual activity: Yes    Partners: Male  Other Topics Concern   Not on file  Social History Narrative   Not on file   Social Drivers of Health   Financial Resource Strain: Not on file  Food Insecurity: No Food Insecurity (03/09/2022)   Hunger Vital Sign    Worried About Running Out of Food in the Last Year: Never true    Ran Out of Food in the Last Year: Never true  Transportation Needs: No Transportation Needs (03/09/2022)   PRAPARE - Administrator, Civil Service (Medical): No    Lack of Transportation (Non-Medical): No  Physical  Activity: Not on file  Stress: Not on file  Social Connections: Not on file  Intimate Partner Violence: Not on file    Review of Systems: ROS negative except for what is noted on the assessment and plan.  Objective:   Vitals:   03/08/23 0915  BP: 126/72  Pulse: 85  Temp: 98 F (36.7 C)  TempSrc: Oral  SpO2: 100%  Weight: (!) 326 lb 11.2 oz (148.2 kg)  Height: 5\' 11"  (1.803 m)    Physical Exam: Constitutional: well-appearing, in no acute distress Cardiovascular: regular rate and rhythm, no m/r/g Pulmonary/Chest: normal work of breathing on room air, lungs clear to auscultation bilaterally GI: Large nonthrombosed external hemorrhoid present at 12:00, no internal hemorrhoids appreciated Neurological: alert & oriented x 3, normal gait Skin: warm and dry  Assessment & Plan:  External hemorrhoid She has history of hemorrhoids but they were last bothersome 20 years ago.  At that time she used a cream that resolved her discomfort.  In the past couple months she has had increased rectal pruritus. Sternal hemorrhoid present on physical exam P: Hydrocortisone cream 1% I talked with her about sitz bath's she does have a bathtub at home. Follow-up  if symptoms do not improve  Hypertension Home medications include hydrochlorothiazide 25 mg. Last BMP this BMP was in January 2024 and renal function was normal at that time.  Pressure well-controlled at 126/72. P: Continue hydrochlorothiazide 25 mg. BMP with GFR within normal limits  Hyperlipidemia Last lipid profile in January 2024 with LDL above goal for primary prevention at 118.  She is adherent with atorvastatin 80 mg daily.  Repeat lipid panel with LDL of 129 P: Continue atorvastatin 80 mg Start Zetia 10 mg Plan to repeat lipid panel at follow-up if greater then 6 weeks out  Health care maintenance pneumonia vaccine given  Patient discussed with Dr. Marjorie Turner Yvonne Turner, D.O. [facility]   PGY-3 Pager: [phone]  Phone: [phone] Date 03/09/2023  Time 8:39 AM

## 2023-03-08 NOTE — Patient Instructions (Addendum)
Thank you, [name] for allowing [place] to provide your care today.   Blood pressure Your blood pressure looks well controlled on current medication. I am checking your kidney today and will call with result.  Cholesterol I am rechecking your cholesterol today and we will call with results.  Please continue taking atorvastatin as you have been.  Hemorrhoids I have sent in a cream for your hemorrhoids.  Please use this twice daily for the next 2 weeks.  I would also encourage you to use a sitz bath.  Included a packet of information on hemorrhoids.  I would encourage you to increase your fiber intake.  If symptoms do not improve, please return to clinic.  Thank you for getting the pneumonia vaccine today.  I have ordered the following labs for you:  Lab Orders         BMP8+Anion Gap         Lipid Profile      I have ordered the following medication/changed the following medications:   Stop the following medications: There are no discontinued medications.   Start the following medications: Meds ordered this encounter  Medications   hydrocortisone cream 1 %    Sig: Apply to affected area 2 times daily for 7 days    Dispense:  14 g    Refill:  0     Follow up: 6 months   We look forward to seeing you next time. Please call our clinic at [phone] if you have any questions or concerns. The best time to call is Monday-Friday from 9am-4pm, but there is someone available 24/7. If after hours or the weekend, call the main hospital number and ask for the Internal Medicine Resident On-Call. If you need medication refills, please notify your pharmacy one week in advance and they will send [place] a request.   Thank you for trusting me with your care. Wishing you the best!   Yvonne Christians, DO [facility]

## 2023-03-09 DIAGNOSIS — K644 Residual hemorrhoidal skin tags: Secondary | ICD-10-CM | POA: Insufficient documentation

## 2023-03-09 LAB — LIPID PANEL
Chol/HDL Ratio: 3.5 {ratio} (ref 0.0–4.4)
Cholesterol, Total: 196 mg/dL (ref 100–199)
HDL: 56 mg/dL (ref >39)
LDL Chol Calc (NIH): 129 mg/dL — ABNORMAL HIGH (ref 0–99)
Triglycerides: 61 mg/dL (ref 0–149)
VLDL Cholesterol Cal: 11 mg/dL (ref 5–40)

## 2023-03-09 LAB — BMP8+ANION GAP
Anion Gap: 18 mmol/L (ref 10.0–18.0)
BUN/Creatinine Ratio: 19 (ref 9–23)
BUN: 10 mg/dL (ref 6–24)
CO2: 23 mmol/L (ref 20–29)
Calcium: 9.2 mg/dL (ref 8.7–10.2)
Chloride: 101 mmol/L (ref 96–106)
Creatinine, Ser: 0.54 mg/dL — ABNORMAL LOW (ref 0.57–1.00)
Glucose: 93 mg/dL (ref 70–99)
Potassium: 4 mmol/L (ref 3.5–5.2)
Sodium: 142 mmol/L (ref 134–144)
eGFR: 106 mL/min/{1.73_m2} (ref >59)

## 2023-03-09 MED ORDER — EZETIMIBE 10 MG PO TABS
10.0000 mg | ORAL_TABLET | Freq: Every day | ORAL | 11 refills | Status: DC
Start: 2023-03-09 — End: 2023-09-04

## 2023-03-09 NOTE — Assessment & Plan Note (Signed)
She has history of hemorrhoids but they were last bothersome 20 years ago.  At that time she used a cream that resolved her discomfort.  In the past couple months she has had increased rectal pruritus. Sternal hemorrhoid present on physical exam P: Hydrocortisone cream 1% I talked with her about sitz bath's she does have a bathtub at home. Follow-up if symptoms do not improve

## 2023-03-09 NOTE — Assessment & Plan Note (Signed)
pneumonia vaccine given

## 2023-03-09 NOTE — Assessment & Plan Note (Signed)
Last lipid profile in January 2024 with LDL above goal for primary prevention at 118.  She is adherent with atorvastatin 80 mg daily.  Repeat lipid panel with LDL of 129 P: Continue atorvastatin 80 mg Start Zetia 10 mg Plan to repeat lipid panel at follow-up if greater then 6 weeks out

## 2023-03-09 NOTE — Assessment & Plan Note (Signed)
Home medications include hydrochlorothiazide 25 mg. Last BMP this BMP was in January 2024 and renal function was normal at that time.  Pressure well-controlled at 126/72. P: Continue hydrochlorothiazide 25 mg. BMP with GFR within normal limits

## 2023-03-13 NOTE — Progress Notes (Signed)
 Internal Medicine Clinic Attending  Case discussed with the resident at the time of the visit.  We reviewed the resident's history and exam and pertinent patient test results.  I agree with the assessment, diagnosis, and plan of care documented in the resident's note.

## 2023-03-15 ENCOUNTER — Telehealth: Payer: Self-pay | Admitting: *Deleted

## 2023-03-15 NOTE — Telephone Encounter (Signed)
 Got Pneumonia shot last Wednesday had a reaction on Thursday.  Site is red and swollen and goes to he arm pits.  Itchy .Did have a fever

## 2023-03-15 NOTE — Telephone Encounter (Signed)
 Spoke with Dr. Jannice Mends patient to come in for a Nurse visit to have Dr. Jannice Mends look at patient's arm.  Patient agreed to come in for a check of the site.

## 2023-03-16 ENCOUNTER — Ambulatory Visit: Payer: 59

## 2023-03-16 DIAGNOSIS — T50Z95A Adverse effect of other vaccines and biological substances, initial encounter: Secondary | ICD-10-CM | POA: Insufficient documentation

## 2023-03-16 MED ORDER — DIPHENHYDRAMINE-ZINC ACETATE 2-0.1 % EX CREA: 1.0000 | TOPICAL_CREAM | CUTANEOUS | 0 refills | Status: AC | PRN

## 2023-03-16 NOTE — Assessment & Plan Note (Signed)
 Her rash is improving.  I talked with her about using topical Benadryl  cream.  She can take a dose of Benadryl  p.o. in the evening when she is at home. I called and talked with Dr. Eben.  He thinks that she reacted to antigen and vaccine and will do well with other types of vaccines.  She has completed pneumococcal vaccine series with PCV 20. P: Topical Benadryl  cream.

## 2023-03-16 NOTE — Patient Instructions (Signed)
 Looks like you have had a reaction to the vaccine last week.  Please try using Benadryl  cream.  You can take a dose of Benadryl  at night when she are in for the evening as this can be sedating.  Also take a few doses of ibuprofen  to help with the discomfort in the next few days.  I talked with Dr. Eben who is a specialist and infectious disease.

## 2023-03-16 NOTE — Progress Notes (Signed)
 Subjective:  CC: rash  HPI:  Yvonne Turner is a 58 y.o. female with a past medical history of lipidemia, hypertension and asthma who presents today for vaccine reaction.  She received PCV 20 conjugated vaccine on January 29.  Later that day she started having itching and a rash on her left upper extremity where vaccine was administered.  She feels that rash is overall improving but came in to be evaluated.  Please see problem based assessment and plan for additional details.  Past Medical History:  Diagnosis Date   Anemia    Asthma    GERD (gastroesophageal reflux disease)    Hyperlipidemia    Hypertension    Right hip pain 08/06/2016   Umbilical hernia     MEDICATIONS:   Family History  Problem Relation Age of Onset   Pancreatic cancer Mother    Diabetes type II Father    BRCA 1/2 Neg Hx    Breast cancer Neg Hx     Past Surgical History:  Procedure Laterality Date   DILATATION & CURETTAGE/HYSTEROSCOPY WITH MYOSURE N/A 11/03/2021   Procedure: DILATATION & CURETTAGE/HYSTEROSCOPY;  Surgeon: Herchel Gloris LABOR, MD;  Location: [facility] OR;  Service: Gynecology;  Laterality: N/A;  rep will be here confirmed on 9/18 CS   DILATION AND CURETTAGE OF UTERUS     patient had this done for AUB     Social History   Socioeconomic History   Marital status: Married    Spouse name: Not on file   Number of children: Not on file   Years of education: Not on file   Highest education level: Not on file  Occupational History   Occupation: [employer]    Comment: [employer]  Tobacco Use   Smoking status: Former    Current packs/day: 0.00    Average packs/day: 0.5 packs/day for 20.0 years (10.0 ttl pk-yrs)    Types: Cigarettes    Start date: 8    Quit date: 61    Years since quitting: 31.1   Smokeless tobacco: Never  Vaping Use   Vaping status: Never Used  Substance and Sexual Activity   Alcohol use: Never   Drug use: Never   Sexual activity: Yes    Partners: Male   Other Topics Concern   Not on file  Social History Narrative   Not on file   Social Drivers of Health   Financial Resource Strain: Not on file  Food Insecurity: No Food Insecurity (03/09/2022)   Hunger Vital Sign    Worried About Running Out of Food in the Last Year: Never true    Ran Out of Food in the Last Year: Never true  Transportation Needs: No Transportation Needs (03/09/2022)   PRAPARE - Administrator, Civil Service (Medical): No    Lack of Transportation (Non-Medical): No  Physical Activity: Not on file  Stress: Not on file  Social Connections: Not on file  Intimate Partner Violence: Not on file    Review of Systems: ROS negative except for what is noted on the assessment and plan.  Objective:  There were no vitals filed for this visit.  Physical Exam: Constitutional: well-appearing, in no acute distress Cardiovascular: regular rate and rhythm, no m/r/g Pulmonary/Chest: normal work of breathing on room air, no wheezing or stridor MSK: normal bulk and tone Skin: well demarcated erythema with wheals of left upper extremity     Assessment & Plan:  Vaccine reaction, initial encounter Her rash is  improving.  I talked with her about using topical Benadryl  cream.  She can take a dose of Benadryl  p.o. in the evening when she is at home. I called and talked with Dr. Eben.  He thinks that she reacted to antigen and vaccine and will do well with other types of vaccines.  She has completed pneumococcal vaccine series with PCV 20. P: Topical Benadryl  cream.  Patient discussed with Dr. Lovie Pagan Alick Lecomte, D.O. [facility]  PGY-3 Pager: [phone]  Phone: [phone] Date 03/16/2023  Time 10:41 AM

## 2023-03-20 NOTE — Progress Notes (Signed)
 Internal Medicine Clinic Attending  Case discussed with the resident at the time of the visit.  We reviewed the resident's history and exam and pertinent patient test results.  I agree with the assessment, diagnosis, and plan of care documented in the resident's note.

## 2023-06-15 IMAGING — MG MM DIGITAL SCREENING BILAT W/ TOMO AND CAD
8 series · 8 of 24 positions shown · non-contrast
Comparison: Previous exam(s).

CLINICAL DATA: Screening.

EXAM:
DIGITAL SCREENING BILATERAL MAMMOGRAM WITH TOMOSYNTHESIS AND CAD
TECHNIQUE: Bilateral screening digital craniocaudal and mediolateral oblique
mammograms were obtained. Bilateral screening digital breast
tomosynthesis was performed. The images were evaluated with
computer-aided detection.

[R MLO synth-2D]
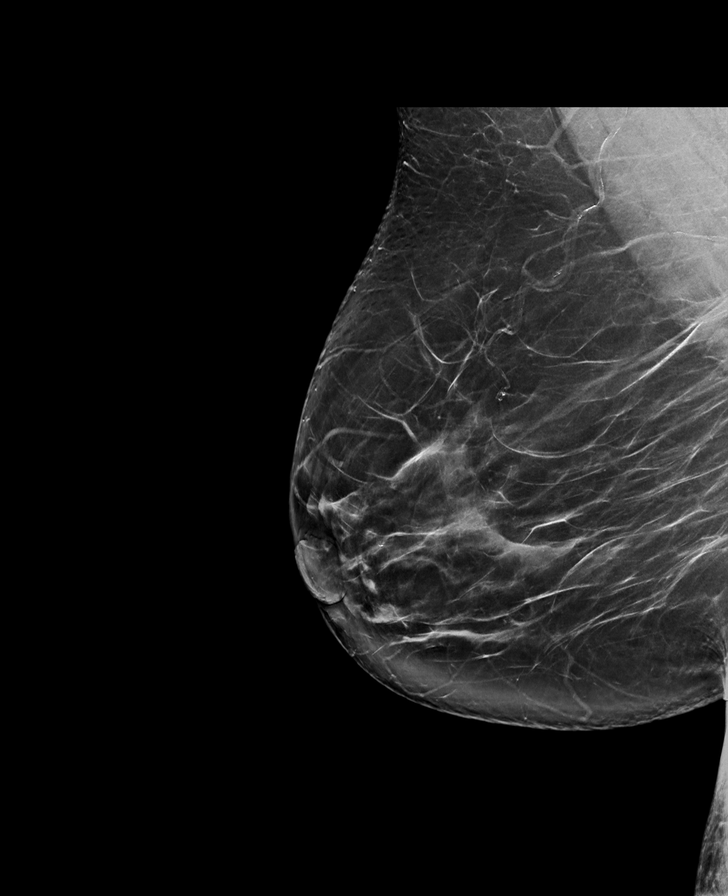

[L MLO synth-2D]
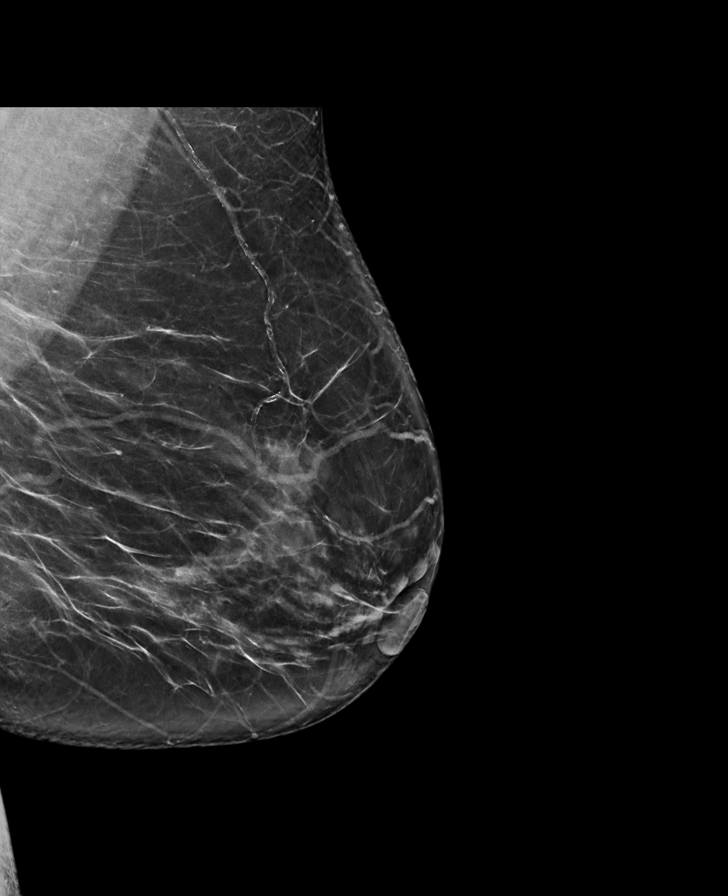

[L CC synth-2D]
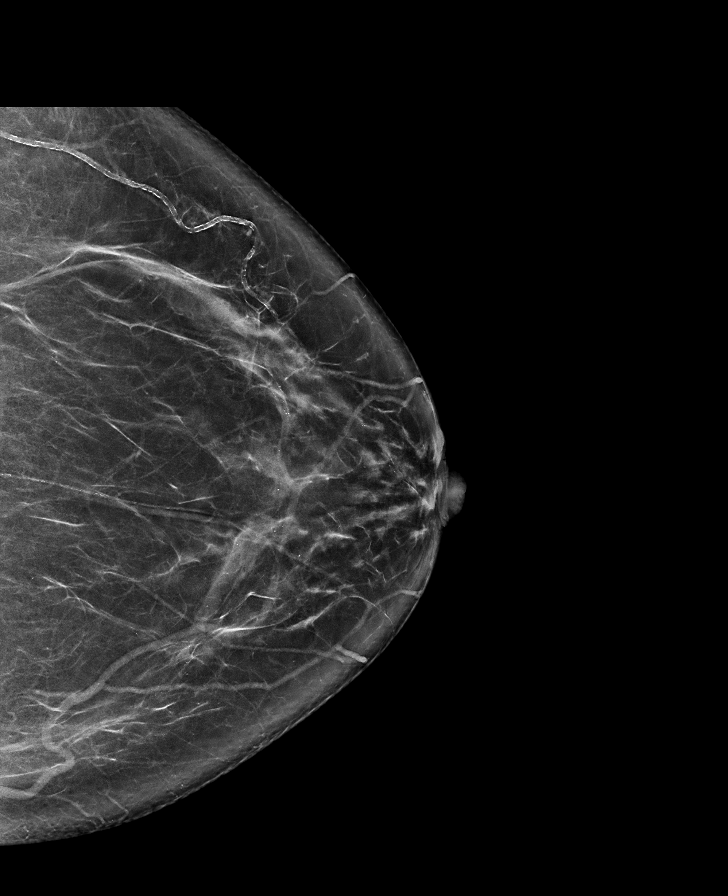

[R CC synth-2D]
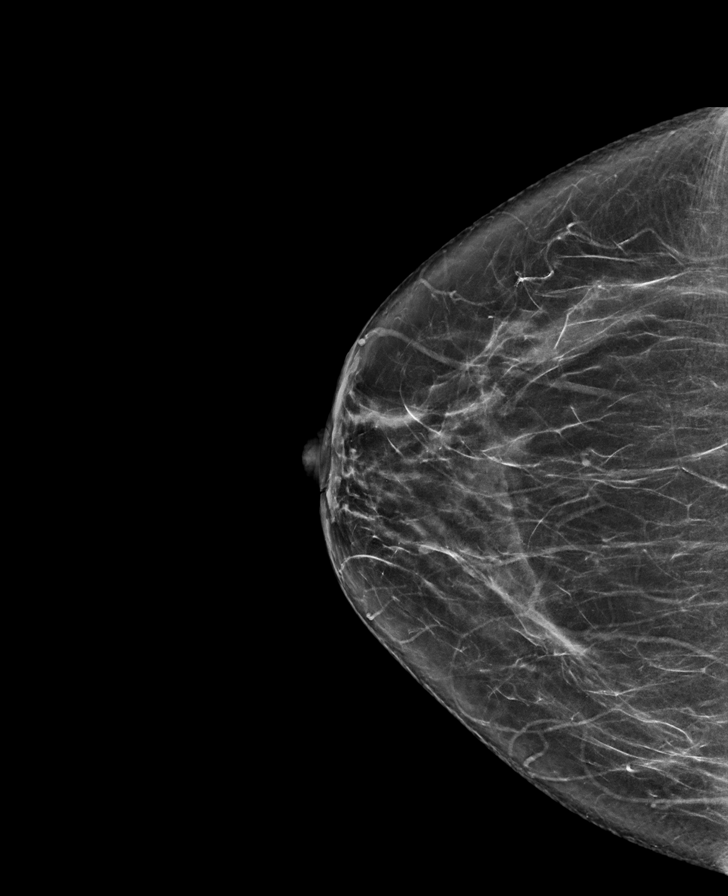

[R MLO tomo · tomo slice 45/90.0]
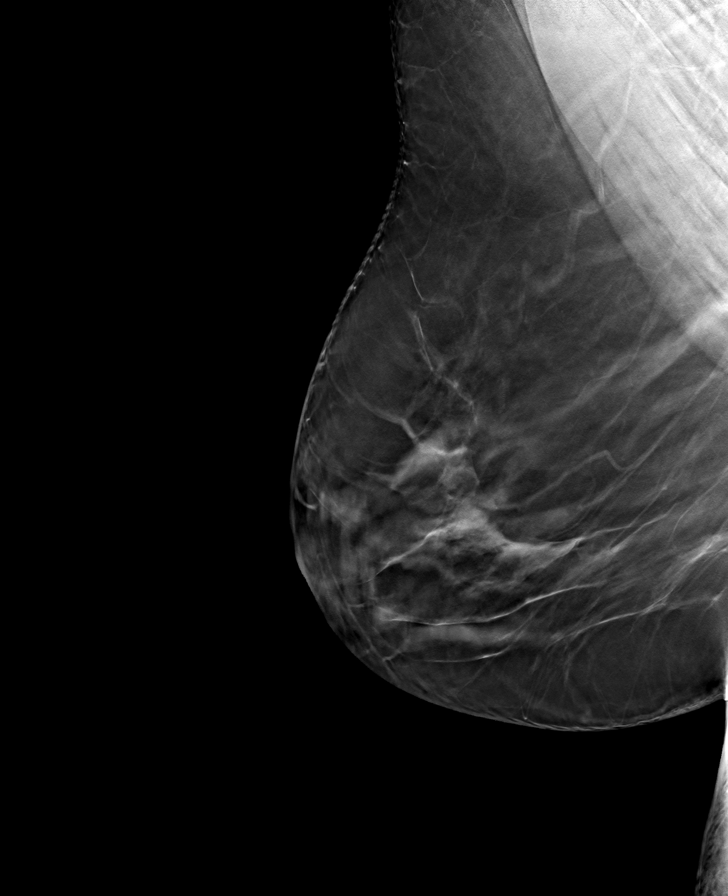

[R CC tomo · tomo slice 38/75.0]
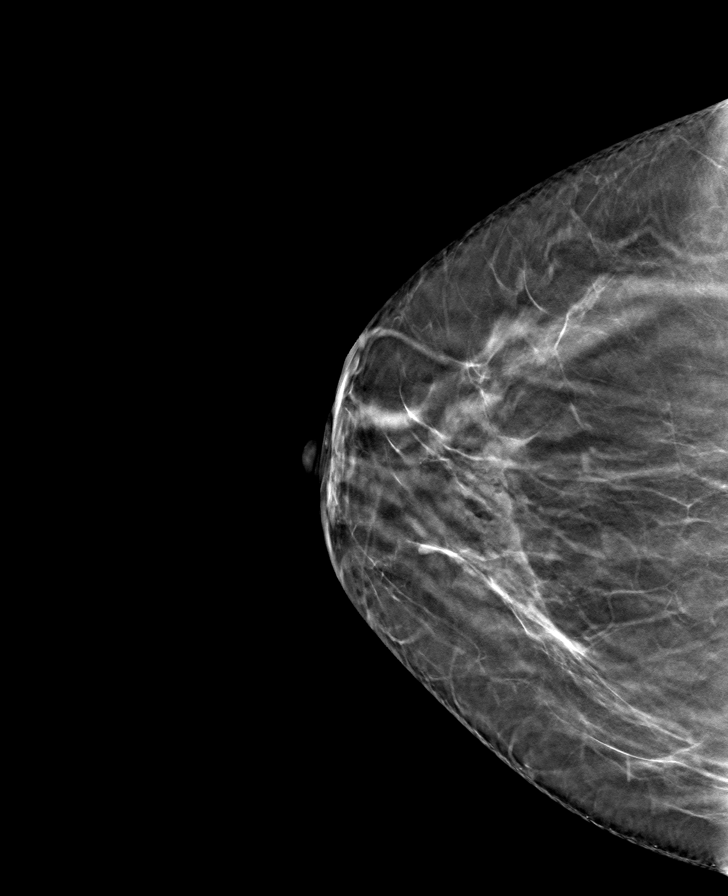

[L MLO tomo · tomo slice 44/87.0]
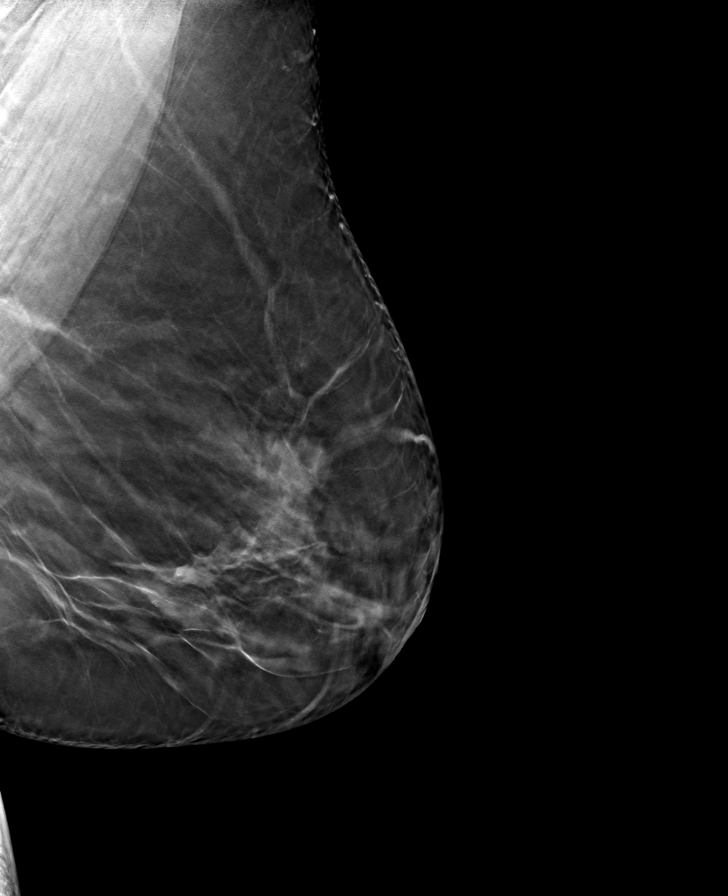

[L CC tomo · tomo slice 39/78.0]
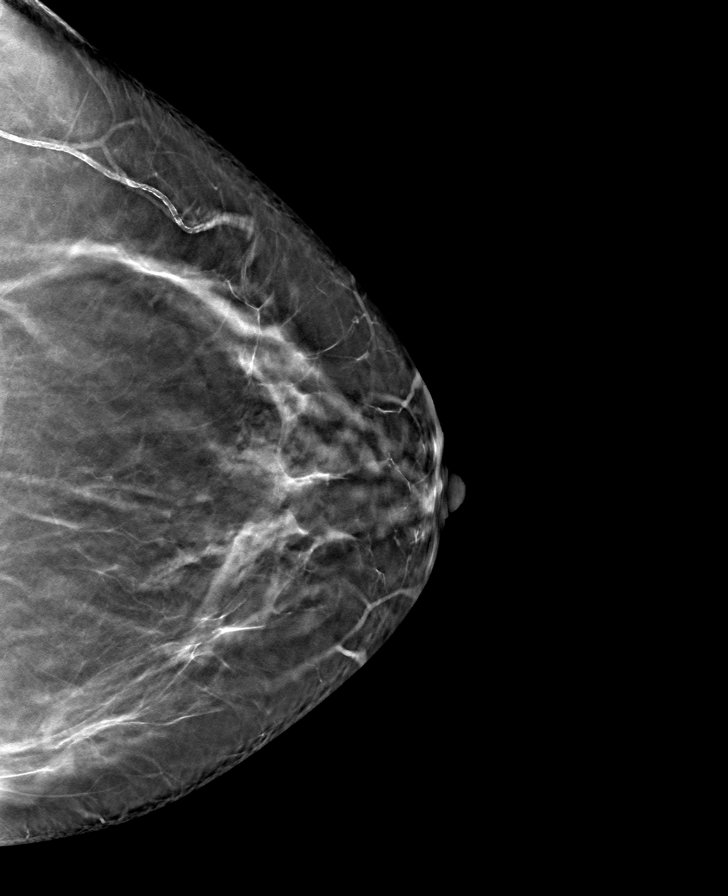

[8 of 24 positions shown; findings below may reference images not displayed]

ACR Breast Density Category b: There are scattered areas of
fibroglandular density.
FINDINGS: There are no findings suspicious for malignancy.
IMPRESSION: No mammographic evidence of malignancy. A result letter of this
screening mammogram will be mailed directly to the patient.

RECOMMENDATION:
Screening mammogram in one year. (Code:51-O-LD2)

BI-RADS CATEGORY  1: Negative.

## 2023-06-16 ENCOUNTER — Other Ambulatory Visit: Payer: Self-pay | Admitting: Student

## 2023-06-16 DIAGNOSIS — J4540 Moderate persistent asthma, uncomplicated: Secondary | ICD-10-CM

## 2023-06-16 MED ORDER — FLUTICASONE FUROATE-VILANTEROL 100-25 MCG/ACT IN AEPB
1.0000 | INHALATION_SPRAY | Freq: Every day | RESPIRATORY_TRACT | 3 refills | Status: DC
Start: 2023-06-16 — End: 2023-09-04

## 2023-06-16 NOTE — Telephone Encounter (Signed)
 Copied from CRM [id number]. Topic: Clinical - Medication Refill >> Jun 16, 2023  9:13 AM Suzette B wrote: Medication:  fluticasone  furoate-vilanterol (BREO ELLIPTA ) 100-25 MCG/ACT AEPB  Has the patient contacted their pharmacy? No  This is the patient's preferred pharmacy:    [id number] - [place], [place] - [address] [place] [address] Phone: [phone] Fax: [phone]    Is this the correct pharmacy for this prescription? Yes If no, delete pharmacy and type the correct one.   Has the prescription been filled recently? No  Is the patient out of the medication? No  Has the patient been seen for an appointment in the last year OR does the patient have an upcoming appointment? No  Can we respond through MyChart? Yes  Agent: Please be advised that Rx refills may take up to 3 business days. We ask that you follow-up with your pharmacy.

## 2023-06-29 ENCOUNTER — Other Ambulatory Visit: Payer: Self-pay | Admitting: Student

## 2023-06-29 DIAGNOSIS — J4540 Moderate persistent asthma, uncomplicated: Secondary | ICD-10-CM

## 2023-06-29 NOTE — Telephone Encounter (Signed)
 Medication last refilled 06/16/23 with 3 refills.

## 2023-07-24 ENCOUNTER — Telehealth: Payer: Self-pay

## 2023-07-24 NOTE — Telephone Encounter (Signed)
 Prior Authorization for patient (fluticasone  furoate-vilanterol (BREO ELLIPTA ) 100-25 MCG/ACT AEPB ) came through via fax from the patients insurance. The form has been completed and has been placed in the box to be faxed back to the patients insurance. Awaiting approval or denial.

## 2023-07-24 NOTE — Telephone Encounter (Signed)
 Dear Yvonne Turner:   Arnaldo Betters pleased to let you know that we've approved your doctors request for coverage for BREO ELLIPTA  100-25 INH. You can now fill your prescription, and It will be covered according to your plan.  As long as you remain covered by your prescription drug plan and there are no changes to your plan benefits. This request is approved from 07/24/23 to 07/24/2026. When this approval expires, please speak to your doctor about your treatment.

## 2023-09-04 ENCOUNTER — Ambulatory Visit: Payer: Self-pay | Admitting: Student

## 2023-09-04 VITALS — BP 130/74 | HR 87 | Temp 97.9°F | Ht 71.0 in | Wt 328.0 lb

## 2023-09-04 DIAGNOSIS — Z6841 Body Mass Index (BMI) 40.0 and over, adult: Secondary | ICD-10-CM

## 2023-09-04 DIAGNOSIS — E785 Hyperlipidemia, unspecified: Secondary | ICD-10-CM | POA: Diagnosis not present

## 2023-09-04 DIAGNOSIS — J45909 Unspecified asthma, uncomplicated: Secondary | ICD-10-CM | POA: Diagnosis not present

## 2023-09-04 DIAGNOSIS — I10 Essential (primary) hypertension: Secondary | ICD-10-CM

## 2023-09-04 DIAGNOSIS — J4540 Moderate persistent asthma, uncomplicated: Secondary | ICD-10-CM

## 2023-09-04 DIAGNOSIS — R7303 Prediabetes: Secondary | ICD-10-CM | POA: Diagnosis not present

## 2023-09-04 DIAGNOSIS — L723 Sebaceous cyst: Secondary | ICD-10-CM

## 2023-09-04 MED ORDER — FLUTICASONE FUROATE-VILANTEROL 100-25 MCG/ACT IN AEPB: 1.0000 | INHALATION_SPRAY | Freq: Every day | RESPIRATORY_TRACT | 3 refills | Status: DC

## 2023-09-04 MED ORDER — EZETIMIBE 10 MG PO TABS: 10.0000 mg | ORAL_TABLET | Freq: Every day | ORAL | 11 refills | Status: AC

## 2023-09-04 MED ORDER — ATORVASTATIN CALCIUM 80 MG PO TABS: 80.0000 mg | ORAL_TABLET | Freq: Every day | ORAL | 3 refills | Status: AC

## 2023-09-04 MED ORDER — HYDROCHLOROTHIAZIDE 25 MG PO TABS: 25.0000 mg | ORAL_TABLET | Freq: Every day | ORAL | 3 refills | Status: AC

## 2023-09-04 NOTE — Assessment & Plan Note (Addendum)
 Repeat A1c today as last A1c 10 months ago was 5.7.  Discussed GLP-1 and she would like to first work on lifestyle modifications including reducing candy intake and increasing activity level.

## 2023-09-04 NOTE — Progress Notes (Signed)
 Internal Medicine Clinic Attending  Case discussed with the resident at the time of the visit.  We reviewed the resident's history and exam and pertinent patient test results.  I agree with the assessment, diagnosis, and plan of care documented in the resident's note.

## 2023-09-04 NOTE — Assessment & Plan Note (Signed)
 Underneath the left breast.  Seen for this approximately 1 year ago and it has persisted.  Dermatology referral sent for excision.

## 2023-09-04 NOTE — Assessment & Plan Note (Signed)
 Managed with Lipitor 80 and Zetia  10.  Repeat lipid panel today.

## 2023-09-04 NOTE — Assessment & Plan Note (Addendum)
 Stable.  Refilled Breo Ellipta 

## 2023-09-04 NOTE — Assessment & Plan Note (Signed)
 Discussed potential use of GLP-1 today.  She would like to work on lifestyle modifications first.  Provided counseling and dietary recommendations today.  Will further discuss GLP 1 at follow-up if weight loss has not improved.

## 2023-09-04 NOTE — Assessment & Plan Note (Signed)
 Normotensive.  Continue HCTZ 25 mg daily which was refilled today.

## 2023-09-04 NOTE — Progress Notes (Addendum)
 CC: Follow-up/Physical  HPI:  Yvonne Turner is a 58 y.o. female living with a history stated below and presents today for follow-up. Please see problem based assessment and plan for additional details.  Past Medical History:  Diagnosis Date   Anemia    Asthma    GERD (gastroesophageal reflux disease)    Hyperlipidemia    Hypertension    Right hip pain 08/06/2016   Umbilical hernia     Current Outpatient Medications on File Prior to Visit  Medication Sig Dispense Refill   albuterol  (VENTOLIN  HFA) 108 (90 Base) MCG/ACT inhaler Inhale 2 puffs into the lungs every 6 (six) hours as needed for wheezing or shortness of breath. 8 g 5   diphenhydrAMINE -zinc  acetate (BENADRYL  EXTRA STRENGTH) cream Apply 1 Application topically as needed for itching. 28.4 g 0   montelukast  (SINGULAIR ) 10 MG tablet Take 1 tablet (10 mg total) by mouth daily. 90 tablet 3   Multiple Vitamin (DAILY-VITE MULTIVITAMIN) TABS Take 1 tablet by mouth daily. 90 tablet 1   timolol (TIMOPTIC) 0.5 % ophthalmic solution 1 drop every morning.     No current facility-administered medications on file prior to visit.    Family History  Problem Relation Age of Onset   Pancreatic cancer Mother    Diabetes type II Father    BRCA 1/2 Neg Hx    Breast cancer Neg Hx     Social History   Socioeconomic History   Marital status: Married    Spouse name: Not on file   Number of children: Not on file   Years of education: Not on file   Highest education level: Not on file  Occupational History   Occupation: [employer]    Comment: [employer]  Tobacco Use   Smoking status: Former    Current packs/day: 0.00    Average packs/day: 0.5 packs/day for 20.0 years (10.0 ttl pk-yrs)    Types: Cigarettes    Start date: 3    Quit date: 31    Years since quitting: 31.5   Smokeless tobacco: Never  Vaping Use   Vaping status: Never Used  Substance and Sexual Activity   Alcohol use: Never   Drug use: Never    Sexual activity: Yes    Partners: Male  Other Topics Concern   Not on file  Social History Narrative   Not on file   Social Drivers of Health   Financial Resource Strain: Not on file  Food Insecurity: No Food Insecurity (03/09/2022)   Hunger Vital Sign    Worried About Running Out of Food in the Last Year: Never true    Ran Out of Food in the Last Year: Never true  Transportation Needs: No Transportation Needs (03/09/2022)   PRAPARE - Administrator, Civil Service (Medical): No    Lack of Transportation (Non-Medical): No  Physical Activity: Not on file  Stress: Not on file  Social Connections: Not on file  Intimate Partner Violence: Not on file    Review of Systems: ROS negative except for what is noted on the assessment and plan.  Vitals:   09/04/23 0820  BP: 130/74  Pulse: 87  Temp: 97.9 F (36.6 C)  TempSrc: Oral  SpO2: 99%  Weight: (!) 328 lb (148.8 kg)  Height: 5' 11 (1.803 m)    Physical Exam: Constitutional: obese, sitting in chair, in no acute distress Cardiovascular: regular rate and rhythm, no m/r/g Pulmonary/Chest: normal work of breathing on room air, lungs  clear to auscultation bilaterally Abdominal: soft, non-tender, non-distended Skin: warm and dry Psych: normal mood and behavior  Assessment & Plan:     Patient discussed with Dr. Jeanelle  Hypertension Normotensive.  Continue HCTZ 25 mg daily which was refilled today.  Hyperlipidemia Managed with Lipitor 80 and Zetia  10.  Repeat lipid panel today.  Prediabetes Repeat A1c today as last A1c 10 months ago was 5.7.  Discussed GLP-1 and she would like to first work on lifestyle modifications including reducing candy intake and increasing activity level.  Sebaceous cyst Underneath the left breast.  Seen for this approximately 1 year ago and it has persisted.  Dermatology referral sent for excision.  Morbid obesity (HCC) Discussed potential use of GLP-1 today.  She would like to  work on lifestyle modifications first.  Provided counseling and dietary recommendations today.  Will further discuss GLP 1 at follow-up if weight loss has not improved.  Asthma Stable.  Refilled Breo Ellipta    Norman Lobstein, D.O. [facility] Internal Medicine, PGY-2 Phone: [phone] Date 09/04/2023 Time 10:39 AM

## 2023-09-04 NOTE — Patient Instructions (Signed)
 I have refilled your medications and sent a referral to dermatology. I will call you with lab results.  Take care!

## 2023-09-05 LAB — BASIC METABOLIC PANEL WITH GFR
BUN/Creatinine Ratio: 18 (ref 12–28)
BUN: 10 mg/dL (ref 8–27)
CO2: 22 mmol/L (ref 20–29)
Calcium: 9.3 mg/dL (ref 8.7–10.3)
Chloride: 101 mmol/L (ref 96–106)
Creatinine, Ser: 0.57 mg/dL (ref 0.57–1.00)
Glucose: 96 mg/dL (ref 70–99)
Potassium: 3.6 mmol/L (ref 3.5–5.2)
Sodium: 142 mmol/L (ref 134–144)
eGFR: 104 mL/min/1.73 (ref >59)

## 2023-09-05 LAB — LIPID PANEL
Chol/HDL Ratio: 3.3 ratio (ref 0.0–4.4)
Cholesterol, Total: 190 mg/dL (ref 100–199)
HDL: 57 mg/dL (ref >39)
LDL Chol Calc (NIH): 121 mg/dL — ABNORMAL HIGH (ref 0–99)
Triglycerides: 66 mg/dL (ref 0–149)
VLDL Cholesterol Cal: 12 mg/dL (ref 5–40)

## 2023-09-05 LAB — HEMOGLOBIN A1C
Est. average glucose Bld gHb Est-mCnc: 123 mg/dL
Hgb A1c MFr Bld: 5.9 % — ABNORMAL HIGH (ref 4.8–5.6)

## 2023-09-06 ENCOUNTER — Ambulatory Visit: Payer: Self-pay | Admitting: Student

## 2023-10-03 ENCOUNTER — Telehealth: Payer: Self-pay | Admitting: *Deleted

## 2023-10-03 NOTE — Telephone Encounter (Signed)
 Copied from CRM [id number]. Topic: Clinical - Medication Question >> Oct 03, 2023 12:59 PM Suzette B wrote: Reason for CRM: [id number], patient has called in regards to some suppositories for hemorrhoids, patient stated that she would prefer the suppository version of the medication that was previously given to her. Please call patient to update her on the status of the providers decision.

## 2023-10-06 ENCOUNTER — Ambulatory Visit: Payer: Self-pay

## 2023-10-06 DIAGNOSIS — K644 Residual hemorrhoidal skin tags: Secondary | ICD-10-CM

## 2023-10-06 NOTE — Telephone Encounter (Signed)
 FYI Only or Action Required?: FYI only for provider.  Patient was last seen in primary care on 09/04/2023 by Marylu Gee, DO.  Called Nurse Triage reporting Rectal Pain.  Symptoms began a week ago.  Interventions attempted: OTC medications: Preparation H.  Symptoms are: unchanged.  Triage Disposition: See PCP When Office is Open (Within 3 Days)  Patient/caregiver understands and will follow disposition?: No appointment, advised to go to urgent care.          Copied from CRM [id number]. Topic: Clinical - Red Word Triage >> Oct 06, 2023  2:49 PM Susanna ORN wrote: Red Word that prompted transfer to Nurse Triage: Patient needing medication for hemorrhoids. Office is closed and she's needing medication.          Reason for Disposition  [1] Home treatment > 3 days for rectal pain AND [2] not improved  Answer Assessment - Initial Assessment Questions 1. SYMPTOM:  What's the main symptom you're concerned about? (e.g., pain, itching, swelling, rash)     Pain 2. ONSET: When did the pain start?     About a week ago  3. RECTAL PAIN: Do you have any pain around your rectum? How bad is the pain?  (Scale 0-10; or none, mild, moderate, severe)     Mild to moderate  4. RECTAL ITCHING: Do you have any itching in this area? How bad is the itching?  (Scale 0-10; or none, mild, moderate, severe)     Mild  5. CONSTIPATION: Do you have constipation? If Yes, ask: How often do you have a bowel movement (BM)?  (Normal range: 3 times a day to every 3 days)  When was your last BM?       No 6. CAUSE: What do you think is causing the anus symptoms?     Hemorrhoids  7. OTHER SYMPTOMS: Do you have any other symptoms?  (e.g., abdomen pain, fever, rectal bleeding, vomiting)     No  Protocols used: Rectal Symptoms-A-AH

## 2023-10-10 MED ORDER — HYDROCORTISONE ACETATE 25 MG RE SUPP: 25.0000 mg | Freq: Two times a day (BID) | RECTAL | 0 refills | Status: DC

## 2023-10-10 NOTE — Telephone Encounter (Signed)
 Prescription for Anusol  suppositories sent for BID dosing x 7 days. If no improvement with this intervention over the next week, please arrange in-person follow up.   Also advise to increase fiber intake (foods, metamucil), increase water intake,  and add daily Miralax to help with frequent and soft bowel movements.

## 2023-10-10 NOTE — Addendum Note (Signed)
 Addended by: KRISTY ROSARIO IP on: 10/10/2023 12:38 PM   Modules accepted: Orders

## 2023-10-10 NOTE — Telephone Encounter (Signed)
 Called pt who stated she needs something for hemorrhoids. Explained to pt when she called on Friday the office was closed and yesterday also for Labor Day. Sending her concern the doctor now. Pt uses [facility] in [place].

## 2023-10-12 ENCOUNTER — Other Ambulatory Visit: Payer: Self-pay | Admitting: Student

## 2023-10-12 DIAGNOSIS — K644 Residual hemorrhoidal skin tags: Secondary | ICD-10-CM

## 2023-10-12 MED ORDER — HYDROCORTISONE ACETATE 25 MG RE SUPP: 25.0000 mg | Freq: Two times a day (BID) | RECTAL | 0 refills | Status: AC

## 2023-10-12 NOTE — Telephone Encounter (Signed)
 Copied from CRM [id number]. Topic: Clinical - Prescription Issue >> Oct 12, 2023  1:16 PM Mercer PEDLAR wrote: Reason for CRM: Patient is calling regarding prescription for hydrocortisone  (ANUSOL -HC) 25 MG suppository. She stated that the pharmacy does not have it and she would like it sent to   [id number] - [place], [place] - [address] [place] [address] Phone: [phone] Fax: [phone]

## 2023-10-13 ENCOUNTER — Other Ambulatory Visit (HOSPITAL_COMMUNITY): Payer: Self-pay

## 2023-10-13 ENCOUNTER — Telehealth: Payer: Self-pay

## 2023-10-13 NOTE — Telephone Encounter (Signed)
 Prior authorization submitted for ANUSOL  HC 25MG  SUPPOSITORIES to [facility] via Latent.   Key: [id number]

## 2023-10-16 NOTE — Telephone Encounter (Signed)
 Pharmacy Patient Advocate Encounter  Received notification from [facility] that Prior Authorization for ANUSOL -[facility] has been DENIED.  Full denial letter will be uploaded to the media tab. See denial reason below.    Suppositories OTC

## 2023-10-23 NOTE — Telephone Encounter (Signed)
 Attempted to call the patient, I was unable to reach her. I lvm letting her know the previous message and to give us  a call back.

## 2023-12-07 ENCOUNTER — Other Ambulatory Visit: Payer: Self-pay | Admitting: Student

## 2023-12-07 DIAGNOSIS — J4540 Moderate persistent asthma, uncomplicated: Secondary | ICD-10-CM

## 2023-12-07 NOTE — Telephone Encounter (Signed)
 Medication sent to pharmacy

## 2023-12-12 ENCOUNTER — Ambulatory Visit: Payer: Self-pay

## 2023-12-12 ENCOUNTER — Ambulatory Visit (INDEPENDENT_AMBULATORY_CARE_PROVIDER_SITE_OTHER): Payer: Self-pay | Admitting: Student

## 2023-12-12 VITALS — BP 124/62 | HR 77 | Temp 97.9°F | Ht 71.0 in | Wt 334.6 lb

## 2023-12-12 DIAGNOSIS — Z23 Encounter for immunization: Secondary | ICD-10-CM | POA: Diagnosis not present

## 2023-12-12 DIAGNOSIS — Z87898 Personal history of other specified conditions: Secondary | ICD-10-CM | POA: Insufficient documentation

## 2023-12-12 DIAGNOSIS — J4540 Moderate persistent asthma, uncomplicated: Secondary | ICD-10-CM | POA: Diagnosis not present

## 2023-12-12 DIAGNOSIS — I10 Essential (primary) hypertension: Secondary | ICD-10-CM

## 2023-12-12 DIAGNOSIS — K644 Residual hemorrhoidal skin tags: Secondary | ICD-10-CM

## 2023-12-12 LAB — GLUCOSE, CAPILLARY: Glucose-Capillary: 84 mg/dL (ref 70–99)

## 2023-12-12 LAB — POCT GLYCOSYLATED HEMOGLOBIN (HGB A1C): HbA1c, POC (prediabetic range): 5.8 % (ref 5.7–6.4)

## 2023-12-12 MED ORDER — TIRZEPATIDE-WEIGHT MANAGEMENT 2.5 MG/0.5ML ~~LOC~~ SOLN: 2.5000 mg | SUBCUTANEOUS | 0 refills | Status: DC

## 2023-12-12 MED ORDER — HYDROCORTISONE 1 % EX CREA: TOPICAL_CREAM | CUTANEOUS | 1 refills | Status: DC

## 2023-12-12 MED ORDER — FLUTICASONE FUROATE-VILANTEROL 100-25 MCG/ACT IN AEPB: 1.0000 | INHALATION_SPRAY | Freq: Every day | RESPIRATORY_TRACT | 3 refills | Status: DC

## 2023-12-12 MED ORDER — ALBUTEROL SULFATE HFA 108 (90 BASE) MCG/ACT IN AERS: 2.0000 | INHALATION_SPRAY | Freq: Four times a day (QID) | RESPIRATORY_TRACT | 5 refills | Status: AC | PRN

## 2023-12-12 NOTE — Assessment & Plan Note (Signed)
 HbA1c 5.72 months ago.  Repeat A1c.

## 2023-12-12 NOTE — Assessment & Plan Note (Addendum)
 Stable, continue HCTZ 25 mg daily.  I will check BMP.

## 2023-12-12 NOTE — Assessment & Plan Note (Addendum)
 Stable, send refills of Breo and albuterol  to her pharmacy. - Flu shot administered.

## 2023-12-12 NOTE — Progress Notes (Signed)
   CC: Follow-up on chronic medical conditions  HPI:  [name] is a 58 y.o. female with a history of morbid obesity, hypertension who is here for follow-up.Yvonne Turner   She has no acute concerns, taking all medications as prescribed.  She would like to get a flu shot today.  Please see problem based assessment and plan for additional details.  Past Medical History:  Diagnosis Date   Anemia    Asthma    GERD (gastroesophageal reflux disease)    Hyperlipidemia    Hypertension    Right hip pain 08/06/2016   Umbilical hernia     Current Outpatient Medications on File Prior to Visit  Medication Sig Dispense Refill   atorvastatin  (LIPITOR) 80 MG tablet Take 1 tablet (80 mg total) by mouth daily. 90 tablet 3   diphenhydrAMINE -zinc  acetate (BENADRYL  EXTRA STRENGTH) cream Apply 1 Application topically as needed for itching. 28.4 g 0   ezetimibe  (ZETIA ) 10 MG tablet Take 1 tablet (10 mg total) by mouth daily. 30 tablet 11   hydrochlorothiazide  (HYDRODIURIL ) 25 MG tablet Take 1 tablet (25 mg total) by mouth daily. 90 tablet 3   hydrocortisone  (ANUSOL -HC) 25 MG suppository Place 1 suppository (25 mg total) rectally 2 (two) times daily. 12 suppository 0   montelukast  (SINGULAIR ) 10 MG tablet Take 1 tablet (10 mg total) by mouth daily. 90 tablet 3   Multiple Vitamin (DAILY-VITE MULTIVITAMIN) TABS Take 1 tablet by mouth daily. 90 tablet 1   timolol (TIMOPTIC) 0.5 % ophthalmic solution 1 drop every morning.     No current facility-administered medications on file prior to visit.    Review of Systems: ROS negative except for what is noted on the assessment and plan.  Vitals:   12/12/23 1005  BP: 124/62  Pulse: 77  Temp: 97.9 F (36.6 C)  TempSrc: Oral  SpO2: 100%  Weight: (!) 334 lb 9.6 oz (151.8 kg)  Height: 5' 11 (1.803 m)      Physical Exam: Constitutional: NAD Cardiovascular: RRR, no murmurs. Pulmonary/Chest: Clear bilateral lungs Abdominal: soft, non-tender,  non-distended.  Assessment & Plan:   Patient discussed with Dr. Lovie  Assessment & Plan Hypertension, unspecified type Stable, continue HCTZ 25 mg daily.  I will check BMP. Moderate persistent asthma without complication Stable, send refills of Breo and albuterol  to her pharmacy. - Flu shot administered. Morbid obesity (HCC) Patient with BMI 47 and comorbid hypertension. Has attempted lifestyle modification for >6 months without adequate weight loss. she denies morning fatigue, snoring, or witnessed apneic episodes, and her STOP-BANG score is low, suggesting low risk for OSA. Given persistent obesity despite lifestyle efforts, the patient would benefit from initiation of a GLP-1 receptor agonist for weight management.  -Tirzepatide 2.5 mg weekly injectiuons - I have messaged Camille to assist with insurance prior authorization for tirzepatide 2.5 mg if insurance declines, I will follow up. History of prediabetes HbA1c 5.72 months ago.  Repeat A1c. Encounter for immunization Flu shot administered as above. External hemorrhoid Denies ongoing constipation; having daily bowel movements. Reports that hydrocortisone  cream provides better pain relief than suppositories and prefers to use it.  -Hydrocortisone  cream prescribed and sent to pharmacy. - Continue conservative measures including MiraLAX for constipation, sitz bath's as needed.  Orders Placed This Encounter  Procedures   Flu vaccine trivalent PF, 6mos and older(Flulaval,Afluria,Fluarix,Fluzone)   BMP8   Glucose, capillary   POC Hbg A1C    Missy Sandhoff, MD [facility], PGY-2  Date 12/12/2023 Time 4:50 PM

## 2023-12-12 NOTE — Assessment & Plan Note (Signed)
 Denies ongoing constipation; having daily bowel movements. Reports that hydrocortisone  cream provides better pain relief than suppositories and prefers to use it.  -Hydrocortisone  cream prescribed and sent to pharmacy. - Continue conservative measures including MiraLAX for constipation, sitz bath's as needed.

## 2023-12-12 NOTE — Assessment & Plan Note (Addendum)
 Patient with BMI 47 and comorbid hypertension. Has attempted lifestyle modification for >6 months without adequate weight loss. she denies morning fatigue, snoring, or witnessed apneic episodes, and her STOP-BANG score is low, suggesting low risk for OSA. Given persistent obesity despite lifestyle efforts, the patient would benefit from initiation of a GLP-1 receptor agonist for weight management.  -Tirzepatide 2.5 mg weekly injectiuons - I have messaged Camille to assist with insurance prior authorization for tirzepatide 2.5 mg if insurance declines, I will follow up.  Addendum: Per Yvonne Turner, patient's insurance currently does not cover Zepbound and Wegovy . There are also no available patient assistance program for weight loss injections.  -Discontinued Tirzepatide

## 2023-12-12 NOTE — Assessment & Plan Note (Signed)
 Flu shot administered as above.

## 2023-12-12 NOTE — Patient Instructions (Addendum)
 It was a pleasure taking care of you today!    Hypertension: Blood pressure is well-controlled. Continue hydrochlorothiazide  25 mg daily.  Asthma: Continue current inhaler regimen as prescribed.  Weight management: Continue healthy diet and regular exercise as discussed. Will consider starting a GLP-1 injectable medication for weight loss at next visit if eligible.  Rectal discomfort / hemorrhoids: Continue hydrocortisone  cream as needed. Maintain regular bowel movements and avoid constipation to reduce pain and irritation.  I have ordered the following labs for you:   Lab Orders         BMP8         POC Hbg A1C       Follow up: 3 months   Should you have any questions or concerns please call the internal medicine clinic at [phone].     Missy Sandhoff, MD  [facility]

## 2023-12-13 ENCOUNTER — Other Ambulatory Visit (HOSPITAL_COMMUNITY): Payer: Self-pay

## 2023-12-13 ENCOUNTER — Ambulatory Visit: Payer: Self-pay | Admitting: Student

## 2023-12-13 LAB — BMP8
BUN: 9 mg/dL (ref 8–27)
CO2: 25 mmol/L (ref 20–29)
Calcium: 9.3 mg/dL (ref 8.7–10.3)
Chloride: 105 mmol/L (ref 96–106)
Creatinine, Ser: 0.63 mg/dL (ref 0.57–1.00)
Glucose: 79 mg/dL (ref 70–99)
Potassium: 3.9 mmol/L (ref 3.5–5.2)
Sodium: 145 mmol/L — ABNORMAL HIGH (ref 134–144)
eGFR: 101 mL/min/1.73 (ref >59)

## 2023-12-13 NOTE — Progress Notes (Signed)
 Internal Medicine Clinic Attending  Case discussed with the resident at the time of the visit.  We reviewed the resident's history and exam and pertinent patient test results.  I agree with the assessment, diagnosis, and plan of care documented in the resident's note.

## 2023-12-13 NOTE — Addendum Note (Signed)
 Addended by: CELESTINA CZAR on: 12/13/2023 05:21 PM   Modules accepted: Orders

## 2023-12-13 NOTE — Progress Notes (Signed)
 BMP with asymptomatic hypernatremia, stable K.  Will encourage adequate hydration, will monitor and repeat labs at next OV. HbA1c stable at 5.8  Patient notified via call.

## 2023-12-14 ENCOUNTER — Telehealth: Payer: Self-pay

## 2023-12-14 NOTE — Telephone Encounter (Signed)
 Received a fax from the pharmacy regarding a rx for Hydrocortisone  1% cream. Per pharmacy the medication is not covered by the patients plan. The preferred alternative is Triamcinolone   cream. Please send in a new rx.   [id number] - [place], [place] - [address] AT [facility] [address] [facility] [address]

## 2023-12-15 MED ORDER — HYDROCORTISONE 1 % EX CREA: TOPICAL_CREAM | CUTANEOUS | 1 refills | Status: AC

## 2023-12-27 ENCOUNTER — Other Ambulatory Visit: Payer: Self-pay | Admitting: Student

## 2023-12-27 DIAGNOSIS — Z1231 Encounter for screening mammogram for malignant neoplasm of breast: Secondary | ICD-10-CM

## 2024-01-10 ENCOUNTER — Ambulatory Visit
Admission: RE | Admit: 2024-01-10 | Discharge: 2024-01-10 | Disposition: A | Source: Ambulatory Visit | Attending: Internal Medicine

## 2024-01-10 DIAGNOSIS — Z1231 Encounter for screening mammogram for malignant neoplasm of breast: Secondary | ICD-10-CM

## 2024-01-16 ENCOUNTER — Ambulatory Visit: Admitting: Pulmonary Disease

## 2024-02-20 ENCOUNTER — Encounter: Payer: Self-pay | Admitting: Pulmonary Disease

## 2024-02-20 ENCOUNTER — Ambulatory Visit: Admitting: Pulmonary Disease

## 2024-02-20 VITALS — BP 124/82 | HR 72 | Ht 71.0 in | Wt 334.2 lb

## 2024-02-20 DIAGNOSIS — Z6841 Body Mass Index (BMI) 40.0 and over, adult: Secondary | ICD-10-CM | POA: Diagnosis not present

## 2024-02-20 DIAGNOSIS — J4540 Moderate persistent asthma, uncomplicated: Secondary | ICD-10-CM | POA: Diagnosis not present

## 2024-02-20 DIAGNOSIS — Z87891 Personal history of nicotine dependence: Secondary | ICD-10-CM | POA: Diagnosis not present

## 2024-02-20 MED ORDER — FLUTICASONE FUROATE-VILANTEROL 100-25 MCG/ACT IN AEPB: 1.0000 | INHALATION_SPRAY | Freq: Every day | RESPIRATORY_TRACT | 5 refills | Status: AC

## 2024-02-20 MED ORDER — ZEPBOUND 2.5 MG/0.5ML ~~LOC~~ SOAJ: 2.5000 mg | SUBCUTANEOUS | 0 refills | Status: AC

## 2024-02-20 NOTE — Assessment & Plan Note (Addendum)
" °  Orders:   fluticasone  furoate-vilanterol (BREO ELLIPTA ) 100-25 MCG/ACT AEPB; Inhale 1 puff into the lungs daily.  "

## 2024-02-20 NOTE — Progress Notes (Signed)
 "  Established Patient Pulmonology Office Visit   Subjective:  Patient ID: Yvonne Turner, female    DOB: 06-18-1965  MRN: [id number]  CC:  Chief Complaint  Patient presents with   Medical Management of Chronic Issues    Pt states weight loss.     Discussed the use of AI scribe software for clinical note transcription with the patient, who gave verbal consent to proceed.  History of Present Illness Yvonne Turner is a 59 year old female with asthma who presents for follow-up of her asthma.  Her asthma is stable with infrequent albuterol  use. She has no nighttime wheezing, cough, snoring, or breathing difficulty and feels rested on waking. She uses Breo Ellipta  100 mcg one puff daily and montelukast  10 mg daily with good symptom control.  She has hypertension and is a former smoker and has not resumed smoking. She notes some leg swelling.  She denies snoring, awakening due to dyspnea or gasping, and no daytime sleepiness.        ROS   Current Medications[1]      Objective:  BP 124/82   Pulse 72   Ht 5' 11 (1.803 m) Comment: per pt  Wt (!) 334 lb 3.2 oz (151.6 kg)   SpO2 98%   BMI 46.61 kg/m     Physical Exam Constitutional:      General: She is not in acute distress.    Appearance: Normal appearance. She is obese.  Eyes:     General: No scleral icterus.    Conjunctiva/sclera: Conjunctivae normal.  Cardiovascular:     Rate and Rhythm: Normal rate and regular rhythm.  Pulmonary:     Breath sounds: No wheezing, rhonchi or rales.  Musculoskeletal:     Right lower leg: No edema.     Left lower leg: No edema.  Skin:    General: Skin is warm and dry.  Neurological:     General: No focal deficit present.      Diagnostic Review:  Last CBC Lab Results  Component Value Date   WBC 5.2 03/02/2022   HGB 13.9 03/02/2022   HCT 42.4 03/02/2022   MCV 85.8 03/02/2022   MCH 28.1 03/02/2022   RDW 14.3 03/02/2022   PLT 191 03/02/2022   Last metabolic panel Lab  Results  Component Value Date   GLUCOSE 79 12/12/2023   NA 145 (H) 12/12/2023   K 3.9 12/12/2023   CL 105 12/12/2023   CO2 25 12/12/2023   BUN 9 12/12/2023   CREATININE 0.63 12/12/2023   EGFR 101 12/12/2023   CALCIUM  9.3 12/12/2023   PROT 7.0 07/02/2020   ALBUMIN 4.2 07/02/2020   LABGLOB 2.8 07/02/2020   AGRATIO 1.5 07/02/2020   BILITOT 0.2 07/02/2020   ALKPHOS 115 07/02/2020   AST 22 07/02/2020   ALT 24 07/02/2020   ANIONGAP 11 03/02/2022       Assessment & Plan:   Assessment & Plan Moderate persistent asthma without complication  Orders:   fluticasone  furoate-vilanterol (BREO ELLIPTA ) 100-25 MCG/ACT AEPB; Inhale 1 puff into the lungs daily.  Morbid obesity (HCC)  Orders:   tirzepatide  (ZEPBOUND ) 2.5 MG/0.5ML Pen; Inject 2.5 mg into the skin once a week.   Assessment and Plan Assessment & Plan Moderate persistent asthma Asthma well-controlled with current regimen. No recent symptoms or signs of sleep apnea. - Continue Breo Ellipta  100 mcg, one puff daily. - Continue montelukast  10 mg daily. - Provided refills for Breo Ellipta  for one year.  Morbid obesity BMI 46,  weight 334lbs. - Start Zepbound  2.5mg  weekly - will consider referral to weight loss management clinic - if zepbound  approved, will have internal medicine team continue therapy.       Return in about 1 year (around 02/19/2025) for f/u visit Dr. Kara.   Dorn KATHEE Kara, MD     [1]  Current Outpatient Medications:    albuterol  (VENTOLIN  HFA) 108 (90 Base) MCG/ACT inhaler, Inhale 2 puffs into the lungs every 6 (six) hours as needed for wheezing or shortness of breath., Disp: 8 g, Rfl: 5   atorvastatin  (LIPITOR) 80 MG tablet, Take 1 tablet (80 mg total) by mouth daily., Disp: 90 tablet, Rfl: 3   diphenhydrAMINE -zinc  acetate (BENADRYL  EXTRA STRENGTH) cream, Apply 1 Application topically as needed for itching., Disp: 28.4 g, Rfl: 0   ezetimibe  (ZETIA ) 10 MG tablet, Take 1 tablet (10 mg total)  by mouth daily., Disp: 30 tablet, Rfl: 11   hydrochlorothiazide  (HYDRODIURIL ) 25 MG tablet, Take 1 tablet (25 mg total) by mouth daily., Disp: 90 tablet, Rfl: 3   hydrocortisone  (ANUSOL -HC) 25 MG suppository, Place 1 suppository (25 mg total) rectally 2 (two) times daily., Disp: 12 suppository, Rfl: 0   hydrocortisone  cream 1 %, Apply to affected area 2 times daily, Disp: 30 g, Rfl: 1   montelukast  (SINGULAIR ) 10 MG tablet, Take 1 tablet (10 mg total) by mouth daily., Disp: 90 tablet, Rfl: 3   Multiple Vitamin (DAILY-VITE MULTIVITAMIN) TABS, Take 1 tablet by mouth daily., Disp: 90 tablet, Rfl: 1   timolol (TIMOPTIC) 0.5 % ophthalmic solution, 1 drop every morning., Disp: , Rfl:    tirzepatide  (ZEPBOUND ) 2.5 MG/0.5ML Pen, Inject 2.5 mg into the skin once a week., Disp: 2 mL, Rfl: 0   fluticasone  furoate-vilanterol (BREO ELLIPTA ) 100-25 MCG/ACT AEPB, Inhale 1 puff into the lungs daily., Disp: 60 each, Rfl: 5  "

## 2024-02-20 NOTE — Assessment & Plan Note (Addendum)
" °  Orders:   tirzepatide  (ZEPBOUND ) 2.5 MG/0.5ML Pen; Inject 2.5 mg into the skin once a week.  "

## 2024-02-20 NOTE — Patient Instructions (Addendum)
 Continue breo ellipta  1 puff daily - rinse mouth out after each use  Continue albuterol  1-2 puffs every 4-6 hours  Continue montelukast  10mg  daily  Start Zepbound  2.5mg  weekly via injection. Will see if insurance will cover the medication.  Follow up in 1 year, call sooner if needed
